# Patient Record
Sex: Female | Born: 1985
Health system: Southern US, Community
[De-identification: ages and names within clinical notes are randomized; demographics above are authoritative.]

## PROBLEM LIST (undated history)

## (undated) ENCOUNTER — Inpatient Hospital Stay (HOSPITAL_COMMUNITY): Payer: Self-pay

## (undated) DIAGNOSIS — IMO0002 Reserved for concepts with insufficient information to code with codable children: Secondary | ICD-10-CM

## (undated) DIAGNOSIS — Z975 Presence of (intrauterine) contraceptive device: Secondary | ICD-10-CM

## (undated) DIAGNOSIS — Z789 Other specified health status: Secondary | ICD-10-CM

## (undated) HISTORY — PX: COLPOSCOPY: SHX161

## (undated) HISTORY — PX: NO PAST SURGERIES: SHX2092

## (undated) HISTORY — DX: Presence of (intrauterine) contraceptive device: Z97.5

---

## 2009-04-19 ENCOUNTER — Ambulatory Visit: Payer: Self-pay | Admitting: Family Medicine

## 2009-04-19 ENCOUNTER — Ambulatory Visit (HOSPITAL_COMMUNITY): Admission: RE | Admit: 2009-04-19 | Discharge: 2009-04-19 | Payer: Self-pay | Admitting: Family Medicine

## 2009-09-26 ENCOUNTER — Inpatient Hospital Stay (HOSPITAL_COMMUNITY): Admission: AD | Admit: 2009-09-26 | Discharge: 2009-09-26 | Payer: Self-pay | Admitting: Obstetrics and Gynecology

## 2009-12-20 ENCOUNTER — Inpatient Hospital Stay (HOSPITAL_COMMUNITY): Admission: AD | Admit: 2009-12-20 | Discharge: 2009-12-22 | Payer: Self-pay | Admitting: Obstetrics and Gynecology

## 2009-12-21 ENCOUNTER — Encounter (INDEPENDENT_AMBULATORY_CARE_PROVIDER_SITE_OTHER): Payer: Self-pay | Admitting: Obstetrics and Gynecology

## 2010-04-08 ENCOUNTER — Ambulatory Visit: Payer: Self-pay | Admitting: Family Medicine

## 2010-06-12 NOTE — L&D Delivery Note (Signed)
Delivery Note   At 11:34 PM a viable female was delivered via Vaginal, Spontaneous Delivery (Presentation: ROA  ).  APGAR: 8, 9; weight 7 lb 3.9 oz (3285 g).   Placenta status: Intact, Spontaneous.  Cord: 3 vessels with the following complications: None.   Anesthesia: 2% lidocaine local infiltration Episiotomy: none Lacerations: 1st degree;Perineal Suture Repair: 3.0 vicryl rapide Est. Blood Loss (mL): 150  Mom to postpartum.  Baby to nursery-stable  Dr. Normand Sloop notified.  Ehtan Delfavero M 04/30/2011, 11:55 PM

## 2010-08-28 LAB — CBC
HCT: 37.6 % (ref 36.0–46.0)
Hemoglobin: 12.5 g/dL (ref 12.0–15.0)
MCH: 30.4 pg (ref 26.0–34.0)
MCHC: 33.3 g/dL (ref 30.0–36.0)
MCHC: 33.7 g/dL (ref 30.0–36.0)
MCV: 92.1 fL (ref 78.0–100.0)
Platelets: 164 10*3/uL (ref 150–400)
RDW: 14.3 % (ref 11.5–15.5)
WBC: 12.8 10*3/uL — ABNORMAL HIGH (ref 4.0–10.5)

## 2010-10-13 LAB — RUBELLA ANTIBODY, IGM: Rubella: IMMUNE

## 2010-10-13 LAB — CBC
HCT: 36 % (ref 36–46)
Platelets: 309 10*3/uL (ref 150–399)

## 2010-10-13 LAB — HEPATITIS B SURFACE ANTIGEN: Hepatitis B Surface Ag: NEGATIVE

## 2010-12-21 ENCOUNTER — Encounter: Payer: Self-pay | Admitting: Family Medicine

## 2011-04-13 ENCOUNTER — Encounter (HOSPITAL_COMMUNITY): Payer: Self-pay | Admitting: *Deleted

## 2011-04-13 ENCOUNTER — Inpatient Hospital Stay (HOSPITAL_COMMUNITY)
Admission: AD | Admit: 2011-04-13 | Discharge: 2011-04-13 | Disposition: A | Discharge status: Home / Self Care | Payer: Medicaid Other | Source: Ambulatory Visit | Attending: Obstetrics and Gynecology | Admitting: Obstetrics and Gynecology

## 2011-04-13 ENCOUNTER — Other Ambulatory Visit (HOSPITAL_COMMUNITY): Payer: Self-pay | Admitting: Obstetrics and Gynecology

## 2011-04-13 ENCOUNTER — Other Ambulatory Visit (HOSPITAL_COMMUNITY): Payer: Self-pay | Admitting: *Deleted

## 2011-04-13 DIAGNOSIS — O368390 Maternal care for abnormalities of the fetal heart rate or rhythm, unspecified trimester, not applicable or unspecified: Secondary | ICD-10-CM | POA: Insufficient documentation

## 2011-04-13 HISTORY — DX: Other specified health status: Z78.9

## 2011-04-13 HISTORY — DX: Reserved for concepts with insufficient information to code with codable children: IMO0002

## 2011-04-13 NOTE — Progress Notes (Addendum)
S:  Pt denies any complaints at the present time.  O:  FHR baseline 120 with moderate variability.  Accels present. No decels present.  FHR Cat 1.  No further arrhythmia       audible.       Toco:  No further UCs noted.       SVE deferred.       Remainder of PE not repeated.  A:  IUP 37w 3d      Intermittent fetal arrhythmia      Hx decreased amniotic fluid volume  P:  Discharge to home per consult with Dr. Su Hilt.      Pt instructed to return to [facility], tomorrow, 04/14/11 @ 3:000pm for ultrasound.      Reviewed signs/symptoms of labor and fetal kick counts.

## 2011-04-13 NOTE — ED Provider Notes (Signed)
History    Chief Complaint  Patient presents with  . Non-stress Test   HPI Pt is a 25yo G2 P1 sent from [facility] office due possible fetal bradycardia and fetal arrhythmia on monitor at office today.  Pt being followed with antepartum testing due to decreased amniotic fluid volumes.  Pt states that on ultrasound today her fluid volume was normal but states baby was "not practicing breathing" and that the heart rate did slow down while on the monitor.  She states the baby was "lying sideways" on ultrasound today. She feeling UCs, ROM or bldg.  She reports her fetus has been moving normally.  No actual paper result of ultrasound available at the time of pt visit.  She denies any other problems at the present time.  She denies use of caffeine.  OB History    Grav Para Term Preterm Abortions TAB SAB Ect Mult Living   2 1 1       1       Past Medical History  Diagnosis Date  . No pertinent past medical history   . Abnormal Pap smear     Resolved as of last pap.  Colpo 1/11    Past Surgical History  Procedure Date  . No past surgeries     Family History  Problem Relation Age of Onset  . Hypertension Maternal Grandmother   . Hypertension Paternal Grandmother     History  Substance Use Topics  . Smoking status: Never Smoker   . Smokeless tobacco: Never Used  . Alcohol Use: No    Allergies: No Known Allergies  Prescriptions prior to admission  Medication Sig Dispense Refill  . acetaminophen (TYLENOL) 325 MG tablet Take 650 mg by mouth every 6 (six) hours as needed. Pain        . Prenatal Vit-Fe Fumarate-FA (PRENATAL MULTIVITAMIN) 60-1 MG tablet Take 1 tablet by mouth daily.         Review of Systems  Constitutional: Negative.   HENT: Negative.   Eyes: Negative.   Respiratory: Negative.   Cardiovascular: Negative.   Gastrointestinal: Negative.   Genitourinary: Negative.   Musculoskeletal: Negative.   Skin: Negative.   Neurological: Negative.   Endo/Heme/Allergies:  Negative.   Psychiatric/Behavioral: Negative.    Physical Exam   Filed Vitals:   04/13/11 1958  BP: 106/53  Pulse: 71   Last menstrual period 07/24/2010. Physical Exam  Constitutional: She is oriented to person, place, and time. She appears well-developed and well-nourished.  HENT:  Head: Normocephalic and atraumatic.  Right Ear: External ear normal.  Left Ear: External ear normal.  Nose: Nose normal.  Eyes: Conjunctivae and EOM are normal. Pupils are equal, round, and reactive to light.  Neck: Normal range of motion. Neck supple. No thyromegaly present.  Cardiovascular: Normal rate, regular rhythm and intact distal pulses.   Respiratory: Effort normal and breath sounds normal.  GI: Soft. Bowel sounds are normal.  Genitourinary:       Deferred  Musculoskeletal: Normal range of motion.  Neurological: She is alert and oriented to person, place, and time. She has normal reflexes.  Skin: Skin is warm and dry.  Psychiatric: She has a normal mood and affect. Her behavior is normal. Judgment and thought content normal.   FHR baseline 130 bpm with moderate variability present.  Accels present.  No decels noted.  Intermittent, non-sustained arrhythmia noted.  FHR Cat 1 at present UCs every 3-5 minutes x 40-60 seconds and mild to palpation. Pt states she is  not feeling UCs.   MAU Course  Procedures FHR monitoring  Assessment and Plan  IUP at 37.3 wks Fetal arrhythmia   Patricia Faith O. 04/13/2011, 6:23 PM

## 2011-04-14 ENCOUNTER — Ambulatory Visit (HOSPITAL_COMMUNITY)
Admission: RE | Admit: 2011-04-14 | Discharge: 2011-04-14 | Disposition: A | Discharge status: Home / Self Care | Payer: Medicaid Other | Source: Ambulatory Visit | Attending: Obstetrics and Gynecology | Admitting: Obstetrics and Gynecology

## 2011-04-14 DIAGNOSIS — O4100X0 Oligohydramnios, unspecified trimester, not applicable or unspecified: Secondary | ICD-10-CM | POA: Insufficient documentation

## 2011-04-14 DIAGNOSIS — Z363 Encounter for antenatal screening for malformations: Secondary | ICD-10-CM | POA: Insufficient documentation

## 2011-04-14 DIAGNOSIS — O368390 Maternal care for abnormalities of the fetal heart rate or rhythm, unspecified trimester, not applicable or unspecified: Secondary | ICD-10-CM | POA: Insufficient documentation

## 2011-04-14 DIAGNOSIS — Z1389 Encounter for screening for other disorder: Secondary | ICD-10-CM | POA: Insufficient documentation

## 2011-04-14 DIAGNOSIS — O358XX0 Maternal care for other (suspected) fetal abnormality and damage, not applicable or unspecified: Secondary | ICD-10-CM | POA: Insufficient documentation

## 2011-04-14 DIAGNOSIS — O3440 Maternal care for other abnormalities of cervix, unspecified trimester: Secondary | ICD-10-CM | POA: Insufficient documentation

## 2011-04-25 ENCOUNTER — Encounter (HOSPITAL_COMMUNITY): Payer: Self-pay | Admitting: *Deleted

## 2011-04-25 ENCOUNTER — Inpatient Hospital Stay (HOSPITAL_COMMUNITY)
Admission: AD | Admit: 2011-04-25 | Discharge: 2011-04-25 | Payer: Medicaid Other | Source: Ambulatory Visit | Attending: Obstetrics and Gynecology | Admitting: Obstetrics and Gynecology

## 2011-04-25 DIAGNOSIS — O99891 Other specified diseases and conditions complicating pregnancy: Secondary | ICD-10-CM | POA: Insufficient documentation

## 2011-04-25 DIAGNOSIS — R109 Unspecified abdominal pain: Secondary | ICD-10-CM | POA: Insufficient documentation

## 2011-04-25 LAB — BASIC METABOLIC PANEL
Calcium: 10.4 mg/dL (ref 8.4–10.5)
Chloride: 101 mEq/L (ref 96–112)
Creatinine, Ser: 0.57 mg/dL (ref 0.50–1.10)
GFR calc non Af Amer: >90 mL/min (ref >90)
Sodium: 133 mEq/L — ABNORMAL LOW (ref 135–145)

## 2011-04-25 LAB — DIFFERENTIAL
Lymphocytes Relative: 28 % (ref 12–46)
Lymphs Abs: 2.1 10*3/uL (ref 0.7–4.0)
Monocytes Absolute: 0.7 10*3/uL (ref 0.1–1.0)
Monocytes Relative: 10 % (ref 3–12)
Neutro Abs: 4.4 10*3/uL (ref 1.7–7.7)

## 2011-04-25 LAB — CBC
HCT: 35.7 % — ABNORMAL LOW (ref 36.0–46.0)
Hemoglobin: 11.7 g/dL — ABNORMAL LOW (ref 12.0–15.0)
MCHC: 32.8 g/dL (ref 30.0–36.0)
MCV: 87.5 fL (ref 78.0–100.0)
Platelets: 165 10*3/uL (ref 150–400)
RBC: 4.08 MIL/uL (ref 3.87–5.11)
WBC: 7.4 10*3/uL (ref 4.0–10.5)

## 2011-04-25 NOTE — Progress Notes (Signed)
Patient states she has been having some contractions that come and go but has a constant pain in the lower abdomen that is constant. Fetal heart rate is irregular. Patient denies any bleeding or leaking.

## 2011-04-25 NOTE — Consult Note (Addendum)
The emergency consult note  Patricia Allen, Patricia Allen  Date of birth: 15-Mar-1986  Medical records number: [id number]  CSN: [id number]  History of illness:  The patient is a 25 year old female, gravida 2 para 1-0-0-1, who presents at 66 weeks and one-day gestation. She [facility] [place] [facility]. She complains of right lower quadrant pain. She denies bleeding and leakage of fluid. She has no GI or GU complaints. The patient complains of slight dizziness today.  Obstetrical history  The patient has had one term vaginal delivery.  Past medical history:  Noncontributory  Drug allergy:  None  Social history:  No cigarettes, alcohol, or recreational drug use.  Review of systems:  The history of present illness.  Family history:  Noncontributory  Physical exam:  BP 106/60  Pulse 81  Temp(Src) 99 F (37.2 C) (Oral)  Resp 20  Ht 5\' 4"  (1.626 m)  Wt 85.639 kg (188 lb 12.8 oz)  BMI 32.41 kg/m2  SpO2 98%  LMP 07/24/2010   HEENT: Normal  Chest: Clear  Heart: Regular rate and rhythm  Abdomen: Gravid and nontender  Pelvic exam :  The cervix is closed, 25% effaced, and -3 in station.  Nonstress test:  Category 1  CBC    Component Value Date/Time   WBC 7.4 04/25/2011 1807   RBC 4.08 04/25/2011 1807   HGB 11.7* 04/25/2011 1807   HCT 35.7* 04/25/2011 1807   PLT 165 04/25/2011 1807   MCV 87.5 04/25/2011 1807   MCH 28.7 04/25/2011 1807   MCHC 32.8 04/25/2011 1807   RDW 15.0 04/25/2011 1807   LYMPHSABS 2.1 04/25/2011 1807   MONOABS 0.7 04/25/2011 1807   EOSABS 0.1 04/25/2011 1807   BASOSABS 0.0 04/25/2011 1807    Basic metabolic panel: Pending  Assessment:  39 week and 1 day gestation  Right lower quadrant pain most likely related to uterine contractions and uterine irritation from an active baby  Dizziness of uncertain etiology  Plan:  The patient will be discharged to  home  She will followup in the office in 2 days  She will do kick counts at home  She will call if her pain gets worse or if the baby has decreased motion  She will take Tylenol for discomfort  Leonard Schwartz MD  Addendum: BMP WNL, with glucose 104.  Nigel Bridgeman, CNM 04/25/11 1935

## 2011-04-30 ENCOUNTER — Encounter (HOSPITAL_COMMUNITY): Payer: Self-pay | Admitting: *Deleted

## 2011-04-30 ENCOUNTER — Inpatient Hospital Stay (HOSPITAL_COMMUNITY)
Admission: AD | Admit: 2011-04-30 | Discharge: 2011-05-02 | DRG: 775 | Disposition: A | Discharge status: Home / Self Care | Payer: Medicaid Other | Source: Ambulatory Visit | Attending: Obstetrics and Gynecology | Admitting: Obstetrics and Gynecology

## 2011-04-30 ENCOUNTER — Encounter (HOSPITAL_COMMUNITY): Payer: Self-pay

## 2011-04-30 ENCOUNTER — Inpatient Hospital Stay (HOSPITAL_COMMUNITY)
Admission: AD | Admit: 2011-04-30 | Discharge: 2011-04-30 | Disposition: A | Discharge status: Home / Self Care | Payer: Medicaid Other | Source: Ambulatory Visit | Attending: Obstetrics and Gynecology | Admitting: Obstetrics and Gynecology

## 2011-04-30 DIAGNOSIS — O479 False labor, unspecified: Secondary | ICD-10-CM | POA: Insufficient documentation

## 2011-04-30 LAB — CBC
Platelets: 157 10*3/uL (ref 150–400)
RDW: 15 % (ref 11.5–15.5)
WBC: 9.3 10*3/uL (ref 4.0–10.5)

## 2011-04-30 MED ORDER — LIDOCAINE HCL (PF) 1 % IJ SOLN
30.0000 mL | INTRAMUSCULAR | Status: DC | PRN
  Administered 2011-04-30: 30 mL via SUBCUTANEOUS
  Filled 2011-04-30: qty 30

## 2011-04-30 MED ORDER — LACTATED RINGERS IV SOLN: INTRAVENOUS | Status: DC

## 2011-04-30 MED ORDER — OXYCODONE-ACETAMINOPHEN 5-325 MG PO TABS: 2.0000 | ORAL_TABLET | ORAL | Status: DC | PRN

## 2011-04-30 MED ORDER — OXYTOCIN BOLUS FROM INFUSION
500.0000 mL | Freq: Once | INTRAVENOUS | Status: DC
  Filled 2011-04-30: qty 1000
  Filled 2011-04-30: qty 500

## 2011-04-30 MED ORDER — BUTORPHANOL TARTRATE 2 MG/ML IJ SOLN: 2.0000 mg | INTRAMUSCULAR | Status: DC | PRN

## 2011-04-30 MED ORDER — ZOLPIDEM TARTRATE 10 MG PO TABS: 10.0000 mg | ORAL_TABLET | Freq: Every evening | ORAL | Status: DC | PRN

## 2011-04-30 MED ORDER — OXYTOCIN 20 UNITS IN LACTATED RINGERS INFUSION - SIMPLE
125.0000 mL/h | Freq: Once | INTRAVENOUS | Status: AC
  Administered 2011-04-30: 125 mL/h via INTRAVENOUS

## 2011-04-30 MED ORDER — ACETAMINOPHEN 325 MG PO TABS: 650.0000 mg | ORAL_TABLET | ORAL | Status: DC | PRN

## 2011-04-30 MED ORDER — ONDANSETRON HCL 4 MG/2ML IJ SOLN: 4.0000 mg | Freq: Four times a day (QID) | INTRAMUSCULAR | Status: DC | PRN

## 2011-04-30 MED ORDER — IBUPROFEN 600 MG PO TABS: 600.0000 mg | ORAL_TABLET | Freq: Four times a day (QID) | ORAL | Status: DC | PRN

## 2011-04-30 MED ORDER — OXYTOCIN 10 UNIT/ML IJ SOLN: 10.0000 [IU] | Freq: Once | INTRAMUSCULAR | Status: DC

## 2011-04-30 MED ORDER — CITRIC ACID-SODIUM CITRATE 334-500 MG/5ML PO SOLN: 30.0000 mL | ORAL | Status: DC | PRN

## 2011-04-30 MED ORDER — LACTATED RINGERS IV SOLN: 500.0000 mL | INTRAVENOUS | Status: DC | PRN

## 2011-04-30 NOTE — H&P (Signed)
Akili Cuda is a 25 y.o. female presenting for onset of ctx, they have been getting closer and stronger. Denies LOF, VB +FM.  Maternal Medical History:  Reason for admission: Reason for admission: contractions.  Contractions: Onset was 6-12 hours ago.   Frequency: regular.   Duration is approximately 60 seconds.   Perceived severity is strong.    Fetal activity: Perceived fetal activity is normal.   Last perceived fetal movement was within the past hour.    Prenatal complications: no prenatal complications   OB History    Grav Para Term Preterm Abortions TAB SAB Ect Mult Living   2 1 1       1      Past Medical History  Diagnosis Date  . No pertinent past medical history   . Abnormal Pap smear     Resolved as of last pap.  Colpo 1/11   Past Surgical History  Procedure Date  . No past surgeries    Family History: family history includes Hypertension in her maternal grandmother and paternal grandmother. Social History:  reports that she has never smoked. She has never used smokeless tobacco. She reports that she does not drink alcohol or use illicit drugs.  Review of Systems  All other systems reviewed and are negative.    Dilation: 8 Effacement (%): 100 Station: -2 Exam by:: Hoeler RN Blood pressure 126/76, pulse 105, temperature 97.9 F (36.6 C), temperature source Oral, resp. rate 20, height 5\' 4"  (1.626 m), weight 83.008 kg (183 lb), last menstrual period 07/24/2010. Maternal Exam:  Uterine Assessment: Contraction strength is firm.  Contraction duration is 60 seconds. Contraction frequency is regular.   Abdomen: Patient reports no abdominal tenderness. Fundal height is aga.   Estimated fetal weight is 8#.   Fetal presentation: vertex  Introitus: Normal vulva. Normal vagina.  Pelvis: adequate for delivery.   Cervix: Cervix evaluated by digital exam.     Fetal Exam Fetal Monitor Review: Mode: ultrasound.   Baseline rate: 120.  Variability: moderate (6-25 bpm).    Pattern: accelerations present and no decelerations.    Fetal State Assessment: Category I - tracings are normal.     Physical Exam  Nursing note and vitals reviewed. Constitutional: She is oriented to person, place, and time. She appears well-developed and well-nourished.  Cardiovascular: Normal rate.   Respiratory: Effort normal.  GI: Soft.  Genitourinary: Vagina normal.  Musculoskeletal: Normal range of motion.  Neurological: She is alert and oriented to person, place, and time.  Skin: Skin is warm and dry.  Psychiatric: She has a normal mood and affect. Her behavior is normal.    Prenatal labs: ABO, Rh: O/Positive/-- (05/03 0000) Antibody: Negative (05/03 0000) Rubella: Immune (05/03 0000) RPR: Nonreactive (05/03 0000)  HBsAg: Negative (05/03 0000)  HIV: Non-reactive (05/03 0000)  GBS:   neg  Assessment/Plan: Active labor FHR reassuring - audible arrythmia   Admit to BS IV pain meds PRN   LILLARD,SHELLEY M 04/30/2011, 11:09 PM

## 2011-04-30 NOTE — Progress Notes (Signed)
Contractions for 2 weeks, now closer and stronger, some vaginal bleeding.

## 2011-04-30 NOTE — ED Provider Notes (Signed)
History     Chief Complaint  Patient presents with  . Contractions   HPI Comments: Pt is a G2P1 at [redacted]w[redacted]d with cc of ctx all night and closer this morning, also bloody show, no LOF, +FM.       Past Medical History  Diagnosis Date  . No pertinent past medical history   . Abnormal Pap smear     Resolved as of last pap.  Colpo 1/11    Past Surgical History  Procedure Date  . No past surgeries     Family History  Problem Relation Age of Onset  . Hypertension Maternal Grandmother   . Hypertension Paternal Grandmother     History  Substance Use Topics  . Smoking status: Never Smoker   . Smokeless tobacco: Never Used  . Alcohol Use: No    Allergies: No Known Allergies  Prescriptions prior to admission  Medication Sig Dispense Refill  . acetaminophen (TYLENOL) 325 MG tablet Take 650 mg by mouth every 6 (six) hours as needed. For pain       . Prenatal Vit-Fe Fumarate-FA (PRENATAL MULTIVITAMIN) 60-1 MG tablet Take 1 tablet by mouth daily.         Review of Systems  All other systems reviewed and are negative.   Physical Exam   Blood pressure 99/59, pulse 107, temperature 98.1 F (36.7 C), temperature source Oral, height 5' 4.5" (1.638 m), weight 83.371 kg (183 lb 12.8 oz), last menstrual period 07/24/2010.  Physical Exam  Nursing note and vitals reviewed. Constitutional: She is oriented to person, place, and time. She appears well-developed and well-nourished.  Neck: Normal range of motion.  Cardiovascular: Normal rate.   Respiratory: Effort normal.  GI: Soft.  Genitourinary: Vagina normal.       Cx=2/60/-2 Scant mucous/bloody show  Musculoskeletal: Normal range of motion.  Neurological: She is alert and oriented to person, place, and time.  Skin: Skin is warm and dry.  Psychiatric: She has a normal mood and affect. Her behavior is normal.   FHR 130 cat 1 Toco - not tracing   MAU Course  Procedures   Assessment and Plan  IUP at [redacted]w[redacted]d +GBS FHR  reassuring  Will continue observe and recheck cervix in 1hr  Patricia Allen M 04/30/2011, 10:54 AM   No cervical change FHR reactive D/C home F/u in office as scheduled on Wed [facility] and false labor sx's given RX for [place] for rest

## 2011-04-30 NOTE — ED Notes (Signed)
Patient does report that two weeks ago baby was breech.

## 2011-05-01 ENCOUNTER — Encounter (HOSPITAL_COMMUNITY): Payer: Self-pay | Admitting: *Deleted

## 2011-05-01 LAB — CBC
HCT: 37.7 % (ref 36.0–46.0)
Hemoglobin: 12.5 g/dL (ref 12.0–15.0)
MCHC: 33.2 g/dL (ref 30.0–36.0)
MCV: 87.5 fL (ref 78.0–100.0)
RDW: 14.9 % (ref 11.5–15.5)

## 2011-05-01 MED ORDER — OXYTOCIN 20 UNITS IN LACTATED RINGERS INFUSION - SIMPLE: 125.0000 mL/h | Freq: Once | INTRAVENOUS | Status: DC

## 2011-05-01 MED ORDER — OXYTOCIN BOLUS FROM INFUSION: 500.0000 mL | Freq: Once | INTRAVENOUS | Status: DC

## 2011-05-01 MED ORDER — ONDANSETRON HCL 4 MG/2ML IJ SOLN: 4.0000 mg | Freq: Four times a day (QID) | INTRAMUSCULAR | Status: DC | PRN

## 2011-05-01 MED ORDER — OXYTOCIN 10 UNIT/ML IJ SOLN: 10.0000 [IU] | Freq: Once | INTRAMUSCULAR | Status: DC

## 2011-05-01 MED ORDER — IBUPROFEN 600 MG PO TABS
600.0000 mg | ORAL_TABLET | Freq: Four times a day (QID) | ORAL | Status: DC
  Administered 2011-05-01 – 2011-05-02 (×6): 600 mg via ORAL
  Filled 2011-05-01 (×6): qty 1

## 2011-05-01 MED ORDER — ACETAMINOPHEN 325 MG PO TABS: 650.0000 mg | ORAL_TABLET | ORAL | Status: DC | PRN

## 2011-05-01 MED ORDER — IBUPROFEN 600 MG PO TABS: 600.0000 mg | ORAL_TABLET | Freq: Four times a day (QID) | ORAL | Status: DC | PRN

## 2011-05-01 MED ORDER — OXYCODONE-ACETAMINOPHEN 5-325 MG PO TABS
1.0000 | ORAL_TABLET | ORAL | Status: DC | PRN
  Administered 2011-05-01: 1 via ORAL
  Filled 2011-05-01: qty 1

## 2011-05-01 MED ORDER — BUTORPHANOL TARTRATE 2 MG/ML IJ SOLN: 2.0000 mg | INTRAMUSCULAR | Status: DC | PRN

## 2011-05-01 MED ORDER — ZOLPIDEM TARTRATE 5 MG PO TABS: 5.0000 mg | ORAL_TABLET | Freq: Every evening | ORAL | Status: DC | PRN

## 2011-05-01 MED ORDER — BENZOCAINE-MENTHOL 20-0.5 % EX AERO
INHALATION_SPRAY | CUTANEOUS | Status: AC
  Administered 2011-05-01: 1 via TOPICAL
  Filled 2011-05-01: qty 56

## 2011-05-01 MED ORDER — WITCH HAZEL-GLYCERIN EX PADS: 1.0000 "application " | MEDICATED_PAD | CUTANEOUS | Status: DC | PRN

## 2011-05-01 MED ORDER — OXYCODONE-ACETAMINOPHEN 5-325 MG PO TABS: 2.0000 | ORAL_TABLET | ORAL | Status: DC | PRN

## 2011-05-01 MED ORDER — CITRIC ACID-SODIUM CITRATE 334-500 MG/5ML PO SOLN: 30.0000 mL | ORAL | Status: DC | PRN

## 2011-05-01 MED ORDER — PRENATAL PLUS 27-1 MG PO TABS
1.0000 | ORAL_TABLET | Freq: Every day | ORAL | Status: DC
  Administered 2011-05-01 – 2011-05-02 (×2): 1 via ORAL
  Filled 2011-05-01 (×2): qty 1

## 2011-05-01 MED ORDER — LANOLIN HYDROUS EX OINT: TOPICAL_OINTMENT | CUTANEOUS | Status: DC | PRN

## 2011-05-01 MED ORDER — LACTATED RINGERS IV SOLN: 500.0000 mL | INTRAVENOUS | Status: DC | PRN

## 2011-05-01 MED ORDER — ONDANSETRON HCL 4 MG/2ML IJ SOLN: 4.0000 mg | INTRAMUSCULAR | Status: DC | PRN

## 2011-05-01 MED ORDER — BENZOCAINE-MENTHOL 20-0.5 % EX AERO
1.0000 "application " | INHALATION_SPRAY | CUTANEOUS | Status: DC | PRN
  Administered 2011-05-01: 1 via TOPICAL

## 2011-05-01 MED ORDER — LACTATED RINGERS IV SOLN: INTRAVENOUS | Status: DC

## 2011-05-01 MED ORDER — SIMETHICONE 80 MG PO CHEW: 80.0000 mg | CHEWABLE_TABLET | ORAL | Status: DC | PRN

## 2011-05-01 MED ORDER — TETANUS-DIPHTH-ACELL PERTUSSIS 5-2.5-18.5 LF-MCG/0.5 IM SUSP: 0.5000 mL | Freq: Once | INTRAMUSCULAR | Status: DC

## 2011-05-01 MED ORDER — LIDOCAINE HCL (PF) 1 % IJ SOLN: 30.0000 mL | INTRAMUSCULAR | Status: DC | PRN

## 2011-05-01 MED ORDER — DIBUCAINE 1 % RE OINT: 1.0000 "application " | TOPICAL_OINTMENT | RECTAL | Status: DC | PRN

## 2011-05-01 MED ORDER — MEASLES, MUMPS & RUBELLA VAC ~~LOC~~ INJ
0.5000 mL | INJECTION | Freq: Once | SUBCUTANEOUS | Status: DC
  Filled 2011-05-01: qty 0.5

## 2011-05-01 MED ORDER — DIPHENHYDRAMINE HCL 25 MG PO CAPS: 25.0000 mg | ORAL_CAPSULE | Freq: Four times a day (QID) | ORAL | Status: DC | PRN

## 2011-05-01 MED ORDER — SENNOSIDES-DOCUSATE SODIUM 8.6-50 MG PO TABS
2.0000 | ORAL_TABLET | Freq: Every day | ORAL | Status: DC
  Administered 2011-05-01: 2 via ORAL

## 2011-05-01 MED ORDER — ONDANSETRON HCL 4 MG PO TABS: 4.0000 mg | ORAL_TABLET | ORAL | Status: DC | PRN

## 2011-05-01 NOTE — Progress Notes (Addendum)
Post Partum Day 1 Subjective: no complaints, up ad lib, voiding, tolerating PO, + flatus and VB lightening.  Working on [employer].  Significant other and son at [facility].  No dizziness.  Newborn in Nursery at this time.  Objective: Blood pressure 112/64, pulse 85, temperature 98.2 F (36.8 C), temperature source Oral, resp. rate 20, height 5\' 4"  (1.626 m), weight 83.008 kg (183 lb), last menstrual period 07/24/2010, SpO2 99.00%, unknown if currently breastfeeding.  Physical Exam:  General: alert, cooperative, no distress and moderately obese Lochia: appropriate Uterine Fundus: firm, below umbilicus Incision: n/a DVT Evaluation: No evidence of DVT seen on physical exam. Negative Homan's sign. No significant calf/ankle edema.   Basename 05/01/11 0520 04/30/11 2240  HGB 12.5 12.4  HCT 37.7 37.2    Assessment/Plan: Plan for discharge tomorrow and Breastfeeding   LOS: 1 day   STEELMAN,CANDICE H 05/01/2011, 2:51 PM    Agree with Above - AYR

## 2011-05-01 NOTE — Progress Notes (Signed)
SVD of viable female 

## 2011-05-01 NOTE — Progress Notes (Signed)
UR chart review completed.  

## 2011-05-02 MED ORDER — IBUPROFEN 600 MG PO TABS: 600.0000 mg | ORAL_TABLET | Freq: Four times a day (QID) | ORAL | Status: AC | PRN

## 2011-05-02 NOTE — Discharge Summary (Signed)
Obstetric Discharge Summary Reason for Admission: onset of labor Prenatal Procedures: ultrasound Intrapartum Procedures: spontaneous vaginal delivery Postpartum Procedures: none Complications-Operative and Postpartum: 1st degree perineal laceration Hemoglobin  Date Value Range Status  05/01/2011 12.5  12.0-15.0 (g/dL) Final     HCT  Date Value Range Status  05/01/2011 37.7  36.0-46.0 (%) Final    Discharge Diagnoses: Term Pregnancy-delivered and lactating; closely-spaced pregnancies; h/o abnl pap smears; conceived on OCP's in the past; limited Prenatal care this pregnancy  Discharge Information: Date: 05/02/2011 Activity: pelvic rest Diet: routine Medications: PNV, Ibuprofen and Colace Condition: stable Instructions: refer to practice specific booklet Discharge to: home Follow-up Information    Follow up with [facility]. (call the office to schedule an appointment in 6 weeks, or call  as needed)          Newborn Data: Live born female "Gavin Pound" (delivered by Sanda Klein, CNM) Birth Weight: 7 lb 3.9 oz (3285 g) APGAR: 8, 9  Home with mother.  Khale Nigh H 05/02/2011, 11:27 AM

## 2012-05-29 ENCOUNTER — Encounter: Payer: Self-pay | Admitting: Medical

## 2012-05-29 ENCOUNTER — Ambulatory Visit (INDEPENDENT_AMBULATORY_CARE_PROVIDER_SITE_OTHER): Payer: Medicaid Other | Admitting: Medical

## 2012-05-29 VITALS — BP 108/70 | HR 80 | Temp 98.0°F | Resp 16 | Wt 172.0 lb

## 2012-05-29 DIAGNOSIS — M62838 Other muscle spasm: Secondary | ICD-10-CM

## 2012-05-29 DIAGNOSIS — G8929 Other chronic pain: Secondary | ICD-10-CM

## 2012-05-29 DIAGNOSIS — M549 Dorsalgia, unspecified: Secondary | ICD-10-CM

## 2012-05-29 MED ORDER — CYCLOBENZAPRINE HCL 10 MG PO TABS: ORAL_TABLET | ORAL | Status: DC

## 2012-05-29 MED ORDER — DICLOFENAC SODIUM 75 MG PO TBEC: 75.0000 mg | DELAYED_RELEASE_TABLET | Freq: Two times a day (BID) | ORAL | Status: DC

## 2012-05-29 NOTE — Progress Notes (Signed)
Subjective: Here for c/o back pain.   She is originally from [place].  She speaks [place] and is here with an interpreter today.  She notes 3 year hx/o back pain, worse in the last 3 mo.  Started after epidural after childbirth.  Pain is intermittent, seems to be worse at night, sometimes interferes with sleep.  Sometimes feels hot sensation, sometimes pain goes up to her head.  Pain is generalized of her back, and at times more mid back midline.  Denies weakness, numbness, tingling, no changes in voiding, no incontinence, no fevers, no weight loss.  Not necessarily worse with activity.  She does not exercise regularly.  Not working currently.   No other aggravating or relieving factors.  Was seen here for this in 2011 and NSAIDs were prescribed short term.   No significant past medical hx/o other than hx/o abnormal pap smear  Review of Systems Constitutional: -fever, -chills, -sweats, -unexpected -weight change,-fatigue Cardiology:  -chest pain, -palpitations, -edema Respiratory: -cough, -shortness of breath, -wheezing Gastroenterology: -abdominal pain, -nausea, -vomiting, -diarrhea, -constipation  Hematology: -bleeding or bruising problems Musculoskeletal: -arthralgias, -myalgias, -joint swelling Ophthalmology: -vision changes Urology: -dysuria, -difficulty urinating, -hematuria, -urinary frequency, -urgency Neurology: +headaches occasional, -weakness, -tingling, -numbness   Objective: Gen: wd, wn, nad, AA female Skin: unremarkable Back: tender throughout paraspinal muscles, some midline mild tenderness, but normal ROM, no obvious scoliosis or deformity MSK: nontender arms, legs, normal ROM, no obvious deformity Neuro: normal UE and LE strength, sensation, DTRs Abdomen: +bs, soft, nontender, no mass, no organomegaly pulses normal  Assessment: Encounter Diagnoses  Name Primary?  . Chronic back pain Yes  . Muscle spasm    Plan: Initially this seems to be back spasm and  musculoskeletal pain.   Will send for xrays.  Begin trial of Diclofenac BID, Flexeril prn, heat, massage, begin daily stretching and some form of exercise.   I did advise her to f/u soon for physical and recheck on history of abnormal pap.

## 2012-05-29 NOTE — Patient Instructions (Signed)
Your symptoms and exam suggests musculoskeletal back pain and spasm  I recommend  Daily stretching of the back  Daily exercise such as walking  You can use heat pad as needed to the back  Consider getting a massage from a massage therapist  Begin Diclofenac twice daily for pain and inflammation  Begin Flexeril at night time for spasm.  This can make you sleepy. Use this as needed, not necessarily every day  We will send you for back xray  I recommend a completed physical sometime in the near future

## 2012-05-30 ENCOUNTER — Ambulatory Visit
Admission: RE | Admit: 2012-05-30 | Discharge: 2012-05-30 | Disposition: A | Discharge status: Home / Self Care | Payer: Medicaid Other | Source: Ambulatory Visit | Attending: Medical | Admitting: Medical

## 2012-05-30 DIAGNOSIS — G8929 Other chronic pain: Secondary | ICD-10-CM

## 2012-05-30 DIAGNOSIS — M62838 Other muscle spasm: Secondary | ICD-10-CM

## 2012-05-31 ENCOUNTER — Ambulatory Visit (INDEPENDENT_AMBULATORY_CARE_PROVIDER_SITE_OTHER): Payer: Medicaid Other | Admitting: Medical

## 2012-05-31 ENCOUNTER — Encounter: Payer: Self-pay | Admitting: Medical

## 2012-05-31 VITALS — BP 102/70 | HR 68 | Temp 98.2°F | Resp 16

## 2012-05-31 DIAGNOSIS — M549 Dorsalgia, unspecified: Secondary | ICD-10-CM

## 2012-05-31 DIAGNOSIS — G8929 Other chronic pain: Secondary | ICD-10-CM

## 2012-05-31 NOTE — Progress Notes (Signed)
Subjective: Here for c/o back pain.  I saw her few days ago.  Here with her husband today who speaks english.   She notes 3 year hx/o back pain, worse in the last 3 mo.  Started after epidural after childbirth.  Pain is intermittent, seems to be worse at night, sometimes interferes with sleep.  Sometimes feels hot sensation, sometimes pain goes up to her head.  Pain is generalized of her back, and at times more mid back midline.  Denies weakness, numbness, tingling, no changes in voiding, no incontinence, no fevers, no weight loss.  Not necessarily worse with activity.  She does not exercise regularly.  Not working currently.   No other aggravating or relieving factors.  Was seen here for this in 2011 and NSAIDs were prescribed short term.   No significant past medical hx/o other than hx/o abnormal pap smear  Review of Systems Constitutional: -fever, -chills, -sweats, -unexpected -weight change,-fatigue Cardiology:  -chest pain, -palpitations, -edema Respiratory: -cough, -shortness of breath, -wheezing Gastroenterology: -abdominal pain, -nausea, -vomiting, -diarrhea, -constipation  Hematology: -bleeding or bruising problems Musculoskeletal: -arthralgias, -myalgias, -joint swelling Ophthalmology: -vision changes Urology: -dysuria, -difficulty urinating, -hematuria, -urinary frequency, -urgency Neurology: +headaches occasional, -weakness, -tingling, -numbness   Objective: Gen: wd, wn, nad, AA female Not examined  Assessment: Encounter Diagnosis  Name Primary?  . Chronic back pain Yes   Plan: Reviewed xray of thoracolumbar spine.  Discussed findings.  Advised stretching, some routine exercise, begin the medications I prescribed last visit, NSAID and prn muscle relaxer.  Will refer to physical therapy.  F/u in 52mo for recheck

## 2012-06-03 ENCOUNTER — Other Ambulatory Visit: Payer: Self-pay

## 2012-06-03 DIAGNOSIS — M549 Dorsalgia, unspecified: Secondary | ICD-10-CM

## 2012-06-11 ENCOUNTER — Ambulatory Visit: Payer: Medicaid Other | Attending: Medical | Admitting: Physical Therapy

## 2012-06-11 DIAGNOSIS — M545 Low back pain, unspecified: Secondary | ICD-10-CM | POA: Insufficient documentation

## 2012-06-11 DIAGNOSIS — IMO0001 Reserved for inherently not codable concepts without codable children: Secondary | ICD-10-CM | POA: Insufficient documentation

## 2012-06-11 DIAGNOSIS — M546 Pain in thoracic spine: Secondary | ICD-10-CM | POA: Insufficient documentation

## 2012-06-14 ENCOUNTER — Telehealth: Payer: Self-pay | Admitting: Family Medicine

## 2012-06-14 NOTE — Telephone Encounter (Signed)
I fax over OV notes,insurance card and xray report to [facility] and they will call the patient to schedule the PT referral. CLS [phone]

## 2012-06-19 ENCOUNTER — Ambulatory Visit: Payer: Medicaid Other | Attending: Medical | Admitting: Physical Therapy

## 2012-06-19 DIAGNOSIS — M545 Low back pain, unspecified: Secondary | ICD-10-CM | POA: Insufficient documentation

## 2012-06-19 DIAGNOSIS — IMO0001 Reserved for inherently not codable concepts without codable children: Secondary | ICD-10-CM | POA: Insufficient documentation

## 2012-06-19 DIAGNOSIS — M546 Pain in thoracic spine: Secondary | ICD-10-CM | POA: Insufficient documentation

## 2012-06-26 ENCOUNTER — Ambulatory Visit: Payer: Medicaid Other | Admitting: Physical Therapy

## 2012-07-03 ENCOUNTER — Ambulatory Visit: Payer: Medicaid Other | Admitting: Rehabilitation

## 2012-11-12 ENCOUNTER — Ambulatory Visit: Payer: Self-pay | Admitting: Family Medicine

## 2013-06-27 ENCOUNTER — Emergency Department (HOSPITAL_COMMUNITY)
Admission: EM | Admit: 2013-06-27 | Discharge: 2013-06-27 | Disposition: A | Discharge status: Home / Self Care | Payer: Medicaid Other | Attending: Emergency Medicine | Admitting: Emergency Medicine

## 2013-06-27 ENCOUNTER — Encounter (HOSPITAL_COMMUNITY): Payer: Self-pay | Admitting: Emergency Medicine

## 2013-06-27 ENCOUNTER — Emergency Department (HOSPITAL_COMMUNITY): Payer: Medicaid Other

## 2013-06-27 DIAGNOSIS — M25579 Pain in unspecified ankle and joints of unspecified foot: Secondary | ICD-10-CM | POA: Insufficient documentation

## 2013-06-27 DIAGNOSIS — R079 Chest pain, unspecified: Secondary | ICD-10-CM | POA: Insufficient documentation

## 2013-06-27 DIAGNOSIS — G8929 Other chronic pain: Secondary | ICD-10-CM | POA: Insufficient documentation

## 2013-06-27 DIAGNOSIS — M25539 Pain in unspecified wrist: Secondary | ICD-10-CM | POA: Insufficient documentation

## 2013-06-27 DIAGNOSIS — M25569 Pain in unspecified knee: Secondary | ICD-10-CM | POA: Insufficient documentation

## 2013-06-27 DIAGNOSIS — M545 Low back pain, unspecified: Secondary | ICD-10-CM | POA: Insufficient documentation

## 2013-06-27 DIAGNOSIS — Z3202 Encounter for pregnancy test, result negative: Secondary | ICD-10-CM | POA: Insufficient documentation

## 2013-06-27 DIAGNOSIS — R21 Rash and other nonspecific skin eruption: Secondary | ICD-10-CM | POA: Insufficient documentation

## 2013-06-27 DIAGNOSIS — M2550 Pain in unspecified joint: Secondary | ICD-10-CM

## 2013-06-27 LAB — CBC
HCT: 37.5 % (ref 36.0–46.0)
HEMOGLOBIN: 12.6 g/dL (ref 12.0–15.0)
MCH: 26.7 pg (ref 26.0–34.0)
MCHC: 33.6 g/dL (ref 30.0–36.0)
MCV: 79.4 fL (ref 78.0–100.0)
Platelets: 296 10*3/uL (ref 150–400)
RBC: 4.72 MIL/uL (ref 3.87–5.11)
RDW: 14 % (ref 11.5–15.5)
WBC: 8.6 10*3/uL (ref 4.0–10.5)

## 2013-06-27 LAB — POCT I-STAT TROPONIN I: Troponin i, poc: 0 ng/mL (ref 0.00–0.08)

## 2013-06-27 LAB — BASIC METABOLIC PANEL
BUN: 9 mg/dL (ref 6–23)
CO2: 22 meq/L (ref 19–32)
Calcium: 9.4 mg/dL (ref 8.4–10.5)
Chloride: 106 mEq/L (ref 96–112)
Creatinine, Ser: 0.76 mg/dL (ref 0.50–1.10)
GFR calc non Af Amer: >90 mL/min (ref >90)
Glucose, Bld: 115 mg/dL — ABNORMAL HIGH (ref 70–99)
POTASSIUM: 4.2 meq/L (ref 3.7–5.3)
Sodium: 140 mEq/L (ref 137–147)

## 2013-06-27 LAB — C-REACTIVE PROTEIN: CRP: 2.7 mg/dL — ABNORMAL HIGH (ref <0.60)

## 2013-06-27 LAB — POCT PREGNANCY, URINE: PREG TEST UR: NEGATIVE

## 2013-06-27 MED ORDER — KETOROLAC TROMETHAMINE 30 MG/ML IJ SOLN
30.0000 mg | Freq: Once | INTRAMUSCULAR | Status: AC
  Administered 2013-06-27: 30 mg via INTRAVENOUS
  Filled 2013-06-27: qty 1

## 2013-06-27 MED ORDER — IBUPROFEN 800 MG PO TABS: 800.0000 mg | ORAL_TABLET | Freq: Three times a day (TID) | ORAL | Status: DC | PRN

## 2013-06-27 NOTE — ED Notes (Signed)
Returned from xray

## 2013-06-27 NOTE — ED Notes (Signed)
Pt able to ambulate to bathroom without assistance

## 2013-06-27 NOTE — ED Provider Notes (Signed)
TIME SEEN: 7:43 AM  CHIEF COMPLAINT: Joint pain, chest pain  HPI: Patient is a 28 y.o. female who has no significant past medical history who presents emergency department with pain in her bilateral elbows and wrists and knees and ankles that started yesterday evening. She reports the pain is worse with movement. She denies a history of injury. She states she did have a rash yesterday that she describes as red and diffuse but is now gone. This morning at 4 AM she woke up with central chest pain that she describes as a "pulling".  She denies any shortness of breath, diaphoresis, nausea or vomiting, fevers or chills. No history of similar symptoms. She states the pain does not radiate. No aggravating or alleviating factors to her chest pain. She has a history of chronic intermittent lower back pain. No prior history of any autoimmune disease, sickle cell. No family history of any medical problems. No history of any injury. No headache, neck pain or neck stiffness. No numbness, tingling or focal weakness.  Denies any history of PE or DVT. No recent prolonged immobilization such as long flight or hospitalization, trauma, surgery, fracture, exogenous hormone use  ROS: See HPI Constitutional: no fever  Eyes: no drainage  ENT: no runny nose   Cardiovascular:   chest pain  Resp: no SOB  GI: no vomiting GU: no dysuria Integumentary: no rash  Allergy: no hives  Musculoskeletal: no leg swelling  Neurological: no slurred speech ROS otherwise negative  PAST MEDICAL HISTORY/PAST SURGICAL HISTORY:  Past Medical History  Diagnosis Date  . No pertinent past medical history   . Abnormal Pap smear     Resolved as of last pap.  Colpo 1/11    MEDICATIONS:  Prior to Admission medications   Medication Sig Start Date End Date Taking? Authorizing Provider  ibuprofen (ADVIL,MOTRIN) 200 MG tablet Take 200 mg by mouth every 6 (six) hours as needed for moderate pain.   Yes Historical Provider, MD     ALLERGIES:  Allergies  Allergen Reactions  . Apple Itching    SOCIAL HISTORY:  History  Substance Use Topics  . Smoking status: Never Smoker   . Smokeless tobacco: Never Used  . Alcohol Use: No    FAMILY HISTORY: Family History  Problem Relation Age of Onset  . Hypertension Maternal Grandmother   . Hypertension Paternal Grandmother     EXAM: BP 136/89  Pulse 88  Temp(Src) 98.7 F (37.1 C) (Oral)  Resp 19  SpO2 100%  LMP 06/27/2013 CONSTITUTIONAL: Alert and oriented and responds appropriately to questions. Well-appearing; well-nourished, nontoxic, in no distress HEAD: Normocephalic EYES: Conjunctivae clear, PERRL ENT: normal nose; no rhinorrhea; moist mucous membranes; pharynx without lesions noted NECK: Supple, no meningismus, no LAD  CARD: RRR; S1 and S2 appreciated; no murmurs, no clicks, no rubs, no gallops RESP: Normal chest excursion without splinting or tachypnea; breath sounds clear and equal bilaterally; no wheezes, no rhonchi, no rales ABD/GI: Normal bowel sounds; non-distended; soft, non-tender, no rebound, no guarding BACK:  The back appears normal and is non-tender to palpation, there is no CVA tenderness EXT: Patient is tender to palpation over her bilateral elbows, wrists, knees and ankles with no effusion, erythema, warmth, induration or fluctuance; 2+ radial and DP pulses bilaterally, sensation to light touch intact diffusely, Normal ROM in all joints; otherwise extremities are non-tender to palpation; no edema; normal capillary refill; no cyanosis; no calf tenderness or swelling SKIN: Normal color for age and race; warm; no rash NEURO:  Moves all extremities equally, cranial nerves 2-12 intact, sensation to light touch intact diffusely PSYCH: The patient's mood and manner are appropriate. Grooming and personal hygiene are appropriate.  MEDICAL DECISION MAKING: Patient here with joint pain that started yesterday and now chest pain. She is no signs of  septic arthritis on exam. No risk factors for ACS or pulmonary embolus. She describes her chest pain as a pulling sensation without any other associated symptoms and denies any radiation of chest pain to me.  No tearing sensation. She has equal pulses in all extremities.  I am not concerned for dissection at this time. Patient denies any history of autoimmune disease, sickle cell. No family history of any medical problems. Will check basic labs, CRP, chest x-ray. Will give Toradol and reassess.  ED PROGRESS: She reports feeling much better after Toradol. Her labs are unremarkable. Chest x-ray clear. Troponin negative. I do not feel she needs serial sets of enzymes given she has no risk factors for ACS and her pain has been constant since early this morning. Bedside ultrasound shows normal ejection fraction with normal-appearing valves with no obvious vegetation and no pericardial effusion. I discussed with patient and her husband that she will need to followup with her primary care physician. They will call to schedule an appointment this week. Have given return precautions. We'll discharge with prescription for ibuprofen. Patient and husband at bedside verbalize understanding and are comfortable with plan.    EKG Interpretation    Date/Time:  Friday June 27 2013 07:18:53 EST Ventricular Rate:  85 PR Interval:  157 QRS Duration: 77 QT Interval:  356 QTC Calculation: 423 R Axis:   76 Text Interpretation:  Sinus rhythm Borderline T abnormalities, anterior leads No old tracing to compare Confirmed by Criss Pallone  DO, Jcion Buddenhagen ([id number]) on 06/27/2013 7:55:09 AM             Layla MawKristen N Tylerjames Hoglund, DO 06/27/13 [id number]

## 2013-06-27 NOTE — ED Notes (Signed)
Patient transported to X-ray 

## 2013-06-27 NOTE — Discharge Instructions (Signed)
Arthralgia °Your caregiver has diagnosed you as suffering from an arthralgia. Arthralgia means there is pain in a joint. This can come from many reasons including: °· Bruising the joint which causes soreness (inflammation) in the joint. °· Wear and tear on the joints which occur as we grow older (osteoarthritis). °· Overusing the joint. °· Various forms of arthritis. °· Infections of the joint. °Regardless of the cause of pain in your joint, most of these different pains respond to anti-inflammatory drugs and rest. The exception to this is when a joint is infected, and these cases are treated with antibiotics, if it is a bacterial infection. °HOME CARE INSTRUCTIONS  °· Rest the injured area for as long as directed by your caregiver. Then slowly start using the joint as directed by your caregiver and as the pain allows. Crutches as directed may be useful if the ankles, knees or hips are involved. If the knee was splinted or casted, continue use and care as directed. If an stretchy or elastic wrapping bandage has been applied today, it should be removed and re-applied every 3 to 4 hours. It should not be applied tightly, but firmly enough to keep swelling down. Watch toes and feet for swelling, bluish discoloration, coldness, numbness or excessive pain. If any of these problems (symptoms) occur, remove the ace bandage and re-apply more loosely. If these symptoms persist, contact your caregiver or return to this location. °· For the first 24 hours, keep the injured extremity elevated on pillows while lying down. °· Apply ice for 15-20 minutes to the sore joint every couple hours while awake for the first half day. Then 03-04 times per day for the first 48 hours. Put the ice in a plastic bag and place a towel between the bag of ice and your skin. °· Wear any splinting, casting, elastic bandage applications, or slings as instructed. °· Only take over-the-counter or prescription medicines for pain, discomfort, or fever as  directed by your caregiver. Do not use aspirin immediately after the injury unless instructed by your physician. Aspirin can cause increased bleeding and bruising of the tissues. °· If you were given crutches, continue to use them as instructed and do not resume weight bearing on the sore joint until instructed. °Persistent pain and inability to use the sore joint as directed for more than 2 to 3 days are warning signs indicating that you should see a caregiver for a follow-up visit as soon as possible. Initially, a hairline fracture (break in bone) may not be evident on X-rays. Persistent pain and swelling indicate that further evaluation, non-weight bearing or use of the joint (use of crutches or slings as instructed), or further X-rays are indicated. X-rays may sometimes not show a small fracture until a week or 10 days later. Make a follow-up appointment with your own caregiver or one to whom we have referred you. A radiologist (specialist in reading X-rays) may read your X-rays. Make sure you know how you are to obtain your X-ray results. Do not assume everything is normal if you do not hear from us. °SEEK MEDICAL CARE IF: °Bruising, swelling, or pain increases. °SEEK IMMEDIATE MEDICAL CARE IF:  °· Your fingers or toes are numb or blue. °· The pain is not responding to medications and continues to stay the same or get worse. °· The pain in your joint becomes severe. °· You develop a fever over 102° F (38.9° C). °· It becomes impossible to move or use the joint. °MAKE SURE YOU:  °·   Understand these instructions.  Will watch your condition.  Will get help right away if you are not doing well or get worse. Document Released: 05/29/2005 Document Revised: 08/21/2011 Document Reviewed: 01/15/2008 [facility] Patient Information 2014 [place].  Chest Pain (Nonspecific) It is often hard to give a specific diagnosis for the cause of chest pain. There is always a chance that your pain could be related to  something serious, such as a heart attack or a blood clot in the lungs. You need to follow up with your caregiver for further evaluation. CAUSES   Heartburn.  Pneumonia or bronchitis.  Anxiety or stress.  Inflammation around your heart (pericarditis) or lung (pleuritis or pleurisy).  A blood clot in the lung.  A collapsed lung (pneumothorax). It can develop suddenly on its own (spontaneous pneumothorax) or from injury (trauma) to the chest.  Shingles infection (herpes zoster virus). The chest wall is composed of bones, muscles, and cartilage. Any of these can be the source of the pain.  The bones can be bruised by injury.  The muscles or cartilage can be strained by coughing or overwork.  The cartilage can be affected by inflammation and become sore (costochondritis). DIAGNOSIS  Lab tests or other studies, such as X-rays, electrocardiography, stress testing, or cardiac imaging, may be needed to find the cause of your pain.  TREATMENT   Treatment depends on what may be causing your chest pain. Treatment may include:  Acid blockers for heartburn.  Anti-inflammatory medicine.  Pain medicine for inflammatory conditions.  Antibiotics if an infection is present.  You may be advised to change lifestyle habits. This includes stopping smoking and avoiding alcohol, caffeine, and chocolate.  You may be advised to keep your head raised (elevated) when sleeping. This reduces the chance of acid going backward from your stomach into your esophagus.  Most of the time, nonspecific chest pain will improve within 2 to 3 days with rest and mild pain medicine. HOME CARE INSTRUCTIONS   If antibiotics were prescribed, take your antibiotics as directed. Finish them even if you start to feel better.  For the next few days, avoid physical activities that bring on chest pain. Continue physical activities as directed.  Do not smoke.  Avoid drinking alcohol.  Only take over-the-counter or  prescription medicine for pain, discomfort, or fever as directed by your caregiver.  Follow your caregiver's suggestions for further testing if your chest pain does not go away.  Keep any follow-up appointments you made. If you do not go to an appointment, you could develop lasting (chronic) problems with pain. If there is any problem keeping an appointment, you must call to reschedule. SEEK MEDICAL CARE IF:   You think you are having problems from the medicine you are taking. Read your medicine instructions carefully.  Your chest pain does not go away, even after treatment.  You develop a rash with blisters on your chest. SEEK IMMEDIATE MEDICAL CARE IF:   You have increased chest pain or pain that spreads to your arm, neck, jaw, back, or abdomen.  You develop shortness of breath, an increasing cough, or you are coughing up blood.  You have severe back or abdominal pain, feel nauseous, or vomit.  You develop severe weakness, fainting, or chills.  You have a fever. THIS IS AN EMERGENCY. Do not wait to see if the pain will go away. Get medical help at once. Call your local emergency services (911 in [place] Do not drive yourself to the hospital. MAKE SURE  YOU:   Understand these instructions.  Will watch your condition.  Will get help right away if you are not doing well or get worse. Document Released: 03/08/2005 Document Revised: 08/21/2011 Document Reviewed: 01/02/2008 [facility] Patient Information 2014 [place].

## 2013-06-27 NOTE — ED Notes (Signed)
Pt c/o CP That radiates to her back, and pain in all 4 extremities.

## 2014-04-02 ENCOUNTER — Ambulatory Visit: Payer: Self-pay | Admitting: Medical

## 2014-04-13 ENCOUNTER — Encounter (HOSPITAL_COMMUNITY): Payer: Self-pay | Admitting: Emergency Medicine

## 2014-04-14 ENCOUNTER — Encounter: Payer: Self-pay | Admitting: Medical

## 2014-06-03 LAB — OB RESULTS CONSOLE GC/CHLAMYDIA
Chlamydia: NEGATIVE
Gonorrhea: NEGATIVE

## 2014-06-12 NOTE — L&D Delivery Note (Signed)
Delivery Note At 9:47 AM a viable female, "Patricia Allen", was delivered via Vaginal, Spontaneous Delivery (Presentation: Left Occiput Anterior).  APGAR: 9, 9;  weight  .   Placenta status: Intact, Spontaneous.  Cord: 3 vessels with the following complications: CAN x 1, loose.  Cord pH: NA  Anesthesia: None  Episiotomy: None Lacerations:  None Suture Repair: NA Est. Blood Loss (mL): 300  Mom to postpartum.  Baby to Couplet care / Skin to Skin. Patient plans outpatient circumcision.  Gonsalo Cuthbertson 09/24/2014, 10:06 AM

## 2014-07-13 LAB — OB RESULTS CONSOLE RPR: RPR: NONREACTIVE

## 2014-07-13 LAB — OB RESULTS CONSOLE GBS: STREP GROUP B AG: NEGATIVE

## 2014-07-13 LAB — OB RESULTS CONSOLE HIV ANTIBODY (ROUTINE TESTING): HIV: NONREACTIVE

## 2014-09-24 ENCOUNTER — Encounter (HOSPITAL_COMMUNITY): Payer: Self-pay | Admitting: *Deleted

## 2014-09-24 ENCOUNTER — Inpatient Hospital Stay (HOSPITAL_COMMUNITY)
Admission: AD | Admit: 2014-09-24 | Discharge: 2014-09-26 | DRG: 775 | Disposition: A | Discharge status: Home / Self Care | Payer: Medicaid Other | Source: Ambulatory Visit | Attending: Obstetrics and Gynecology | Admitting: Obstetrics and Gynecology

## 2014-09-24 ENCOUNTER — Inpatient Hospital Stay (HOSPITAL_COMMUNITY): Payer: Medicaid Other

## 2014-09-24 DIAGNOSIS — Z9889 Other specified postprocedural states: Secondary | ICD-10-CM

## 2014-09-24 DIAGNOSIS — Z6833 Body mass index (BMI) 33.0-33.9, adult: Secondary | ICD-10-CM

## 2014-09-24 DIAGNOSIS — O288 Other abnormal findings on antenatal screening of mother: Secondary | ICD-10-CM

## 2014-09-24 DIAGNOSIS — Z3483 Encounter for supervision of other normal pregnancy, third trimester: Secondary | ICD-10-CM | POA: Diagnosis present

## 2014-09-24 DIAGNOSIS — Z6836 Body mass index (BMI) 36.0-36.9, adult: Secondary | ICD-10-CM

## 2014-09-24 DIAGNOSIS — O0930 Supervision of pregnancy with insufficient antenatal care, unspecified trimester: Secondary | ICD-10-CM

## 2014-09-24 DIAGNOSIS — Z3A38 38 weeks gestation of pregnancy: Secondary | ICD-10-CM | POA: Diagnosis present

## 2014-09-24 DIAGNOSIS — Z789 Other specified health status: Secondary | ICD-10-CM | POA: Diagnosis present

## 2014-09-24 LAB — TYPE AND SCREEN
ABO/RH(D): O POS
Antibody Screen: NEGATIVE

## 2014-09-24 LAB — CBC
HCT: 36.8 % (ref 36.0–46.0)
HEMOGLOBIN: 12.8 g/dL (ref 12.0–15.0)
MCH: 29.4 pg (ref 26.0–34.0)
MCHC: 34.8 g/dL (ref 30.0–36.0)
MCV: 84.6 fL (ref 78.0–100.0)
Platelets: 185 10*3/uL (ref 150–400)
RBC: 4.35 MIL/uL (ref 3.87–5.11)
RDW: 14.7 % (ref 11.5–15.5)
WBC: 8.7 10*3/uL (ref 4.0–10.5)

## 2014-09-24 LAB — RPR: RPR Ser Ql: NONREACTIVE

## 2014-09-24 LAB — ABO/RH: ABO/RH(D): O POS

## 2014-09-24 MED ORDER — CITRIC ACID-SODIUM CITRATE 334-500 MG/5ML PO SOLN: 30.0000 mL | ORAL | Status: DC | PRN

## 2014-09-24 MED ORDER — ACETAMINOPHEN 325 MG PO TABS: 650.0000 mg | ORAL_TABLET | ORAL | Status: DC | PRN

## 2014-09-24 MED ORDER — ACETAMINOPHEN 325 MG PO TABS
650.0000 mg | ORAL_TABLET | ORAL | Status: DC | PRN
  Administered 2014-09-24: 650 mg via ORAL
  Filled 2014-09-24: qty 2

## 2014-09-24 MED ORDER — IBUPROFEN 600 MG PO TABS
600.0000 mg | ORAL_TABLET | Freq: Four times a day (QID) | ORAL | Status: DC
  Administered 2014-09-24 – 2014-09-26 (×8): 600 mg via ORAL
  Filled 2014-09-24 (×8): qty 1

## 2014-09-24 MED ORDER — LIDOCAINE HCL (PF) 1 % IJ SOLN
30.0000 mL | INTRAMUSCULAR | Status: DC | PRN
  Filled 2014-09-24: qty 30

## 2014-09-24 MED ORDER — LANOLIN HYDROUS EX OINT: TOPICAL_OINTMENT | CUTANEOUS | Status: DC | PRN

## 2014-09-24 MED ORDER — ONDANSETRON HCL 4 MG PO TABS: 4.0000 mg | ORAL_TABLET | ORAL | Status: DC | PRN

## 2014-09-24 MED ORDER — ONDANSETRON HCL 4 MG/2ML IJ SOLN: 4.0000 mg | INTRAMUSCULAR | Status: DC | PRN

## 2014-09-24 MED ORDER — OXYTOCIN 40 UNITS IN LACTATED RINGERS INFUSION - SIMPLE MED
62.5000 mL/h | INTRAVENOUS | Status: DC
  Administered 2014-09-24: 62.5 mL/h via INTRAVENOUS
  Filled 2014-09-24: qty 1000

## 2014-09-24 MED ORDER — OXYCODONE-ACETAMINOPHEN 5-325 MG PO TABS: 2.0000 | ORAL_TABLET | ORAL | Status: DC | PRN

## 2014-09-24 MED ORDER — BUTORPHANOL TARTRATE 1 MG/ML IJ SOLN
1.0000 mg | INTRAMUSCULAR | Status: DC | PRN
  Administered 2014-09-24: 1 mg via INTRAVENOUS

## 2014-09-24 MED ORDER — WITCH HAZEL-GLYCERIN EX PADS: 1.0000 "application " | MEDICATED_PAD | CUTANEOUS | Status: DC | PRN

## 2014-09-24 MED ORDER — OXYTOCIN 40 UNITS IN LACTATED RINGERS INFUSION - SIMPLE MED
1.0000 m[IU]/min | INTRAVENOUS | Status: DC
  Administered 2014-09-24: 3 m[IU]/min via INTRAVENOUS
  Administered 2014-09-24: 1 m[IU]/min via INTRAVENOUS

## 2014-09-24 MED ORDER — OXYTOCIN BOLUS FROM INFUSION: 500.0000 mL | INTRAVENOUS | Status: DC

## 2014-09-24 MED ORDER — LACTATED RINGERS IV SOLN: 500.0000 mL | INTRAVENOUS | Status: DC | PRN

## 2014-09-24 MED ORDER — FLEET ENEMA 7-19 GM/118ML RE ENEM: 1.0000 | ENEMA | RECTAL | Status: DC | PRN

## 2014-09-24 MED ORDER — OXYCODONE-ACETAMINOPHEN 5-325 MG PO TABS
1.0000 | ORAL_TABLET | ORAL | Status: DC | PRN
  Administered 2014-09-25 – 2014-09-26 (×2): 1 via ORAL
  Filled 2014-09-24 (×2): qty 1

## 2014-09-24 MED ORDER — OXYCODONE-ACETAMINOPHEN 5-325 MG PO TABS: 1.0000 | ORAL_TABLET | ORAL | Status: DC | PRN

## 2014-09-24 MED ORDER — TERBUTALINE SULFATE 1 MG/ML IJ SOLN
0.2500 mg | Freq: Once | INTRAMUSCULAR | Status: DC | PRN
  Filled 2014-09-24: qty 1

## 2014-09-24 MED ORDER — LACTATED RINGERS IV SOLN
INTRAVENOUS | Status: DC
  Administered 2014-09-24: 07:00:00 via INTRAUTERINE

## 2014-09-24 MED ORDER — DIPHENHYDRAMINE HCL 25 MG PO CAPS
25.0000 mg | ORAL_CAPSULE | Freq: Four times a day (QID) | ORAL | Status: DC | PRN
Start: 2014-09-24 — End: 2014-09-26

## 2014-09-24 MED ORDER — ZOLPIDEM TARTRATE 5 MG PO TABS: 5.0000 mg | ORAL_TABLET | Freq: Every evening | ORAL | Status: DC | PRN

## 2014-09-24 MED ORDER — BENZOCAINE-MENTHOL 20-0.5 % EX AERO
1.0000 "application " | INHALATION_SPRAY | CUTANEOUS | Status: DC | PRN
  Administered 2014-09-24: 1 via TOPICAL
  Filled 2014-09-24: qty 56

## 2014-09-24 MED ORDER — TETANUS-DIPHTH-ACELL PERTUSSIS 5-2.5-18.5 LF-MCG/0.5 IM SUSP
0.5000 mL | Freq: Once | INTRAMUSCULAR | Status: AC
  Administered 2014-09-25: 0.5 mL via INTRAMUSCULAR

## 2014-09-24 MED ORDER — NALBUPHINE HCL 10 MG/ML IJ SOLN
10.0000 mg | INTRAMUSCULAR | Status: DC | PRN
  Administered 2014-09-24: 10 mg via INTRAVENOUS
  Filled 2014-09-24: qty 1

## 2014-09-24 MED ORDER — SIMETHICONE 80 MG PO CHEW: 80.0000 mg | CHEWABLE_TABLET | ORAL | Status: DC | PRN

## 2014-09-24 MED ORDER — BUTORPHANOL TARTRATE 1 MG/ML IJ SOLN
INTRAMUSCULAR | Status: AC
  Filled 2014-09-24: qty 1

## 2014-09-24 MED ORDER — ONDANSETRON HCL 4 MG/2ML IJ SOLN: 4.0000 mg | Freq: Four times a day (QID) | INTRAMUSCULAR | Status: DC | PRN

## 2014-09-24 MED ORDER — LACTATED RINGERS IV SOLN
INTRAVENOUS | Status: DC
  Administered 2014-09-24: 04:00:00 via INTRAVENOUS

## 2014-09-24 MED ORDER — DIBUCAINE 1 % RE OINT: 1.0000 "application " | TOPICAL_OINTMENT | RECTAL | Status: DC | PRN

## 2014-09-24 MED ORDER — PRENATAL MULTIVITAMIN CH
1.0000 | ORAL_TABLET | Freq: Every day | ORAL | Status: DC
  Administered 2014-09-25: 1 via ORAL
  Filled 2014-09-24: qty 1

## 2014-09-24 MED ORDER — SENNOSIDES-DOCUSATE SODIUM 8.6-50 MG PO TABS
2.0000 | ORAL_TABLET | ORAL | Status: DC
  Administered 2014-09-25 (×2): 2 via ORAL
  Filled 2014-09-24 (×2): qty 2

## 2014-09-24 NOTE — Progress Notes (Signed)
UR chart review completed.  

## 2014-09-24 NOTE — Lactation Note (Signed)
This note was copied from the chart of Patricia Allen Collison. Lactation Consultation Note Initial visit at 12 hours of age.  Mom reports baby is a little gaggy right now.  Mom reports baby has breastfed and she has nipple pain.  Offered to assist with hand expression and mom declines due to feeling sore already.  Discussed with mom hand expression prior to a feeding can help.  Discussed a wide open mouth for deep latch.  Discussed exclusively breastfeeding benefits.  Mom is not engaged well with visit.  LC to follow up later.  Report to RN who reports has asked mom to call for latch assist and mom has not.    Patient Name: Patricia Allen Udovich [id number]'s Date: 09/24/2014 Reason for consult: Initial assessment   Maternal Data Has patient been taught Hand Expression?:  (mom declines assist at this time) Does the patient have breastfeeding experience prior to this delivery?: Yes  Feeding Feeding Type: Breast Fed Length of feed: 0 min  LATCH Score/Interventions                      Lactation Tools Discussed/Used WIC Program: No   Consult Status Consult Status: Follow-up Date: 09/25/14 Follow-up type: In-patient    Jannifer RodneyShoptaw, Deborah Lazcano Lynn 09/24/2014, 10:38 PM

## 2014-09-24 NOTE — Discharge Summary (Signed)
  Vaginal Delivery Discharge Summary  Patricia Allen  DOB:    08/13/1985 MRN:    [id number] CSN:    [id number]  Date of admission:                  09/24/14  Date of discharge:                   09/26/14  Procedures this admission:   SVB  Date of Delivery: 09/24/14  Newborn Data:  Live born female  Birth Weight:   APGAR: 9, 9  Home with mother. Name: Patricia Allen Circumcision Plan: Outpatient  History of Present Illness:  Patricia Allen is a 29 y.o. female, G3P2002, who presents at [id number] weeks gestation. [facility] She was admitted onset of labor. Her pregnancy has been complicated by:  Patient Active Problem List   Diagnosis Date Noted  . Language barrier 09/24/2014  . Late prenatal care 09/24/2014  . History of colposcopy with cervical biopsy - 06/2009 09/24/2014  . BMI 36.0-36.9,adult 09/24/2014  . Vaginal delivery 09/24/2014     Hospital Course:  Admitted 09/24/14 in early labor. Negative GBS. Progressed with AROM and minimal pitocin augmentation. Utilized IV meds for pain management.  Delivery was performed by Nigel BridgemanVicki Tedi Hughson, CNM, without complication. Patient and baby tolerated the procedure without difficulty, with no laceration noted. Infant status was stable and remained in room with mother.  Mother and infant then had an uncomplicated postpartum course, with breast feeding going well. Mom's physical exam was WNL, and she was discharged home in stable condition. Contraception plan was undecided at the time of d/c.  She received adequate benefit from po pain medications, using Motrin and Percocet with benefit.   Feeding:  breast  Contraception:  Undecided  Hemoglobin Results:  CBC Latest Ref Rng 09/25/2014 09/24/2014 06/27/2013  WBC 4.0 - 10.5 K/uL 12.6(H) 8.7 8.6  Hemoglobin 12.0 - 15.0 g/dL 11.0(L) 12.8 12.6  Hematocrit 36.0 - 46.0 % 32.8(L) 36.8 37.5   Platelets 150 - 400 K/uL 172 185 296     Discharge Physical Exam:   General: alert Lochia: appropriate Uterine Fundus: firm Incision: NA--perineum intact DVT Evaluation: No evidence of DVT seen on physical exam. Negative Homan's sign.  Intrapartum Procedures: spontaneous vaginal delivery Postpartum Procedures: none Complications-Operative and Postpartum: none  Discharge Diagnoses: Term Pregnancy-delivered  Discharge Information:  Activity:           pelvic rest Diet:                routine Medications: Ibuprofen, Iron and Percocet Condition:      stable Instructions:  Routine pp   Discharge to: home  Follow-up Information    [facility]. Schedule an appointment as soon as possible for a visit in 6 weeks.   Specialty:  Obstetrics and Gynecology   Why:  Call for any questions or concerns.   Contact information:   [address] [place] [id number] [phone]       Nigel BridgemanLATHAM, Abri Vacca [facility] 09/26/2014 9:14 AM

## 2014-09-24 NOTE — H&P (Signed)
Patricia Allen is a 29 y.o. female, G3P2002 at 38.6 weeks, presenting for ctxs less than 5 min apart x 2 days, worse in intensity over past hour. Cvx 3/50/-3 on arrival, progressing to 4/70/-3 after BPP. Did not ambulate post BPP due to increased pain level. Reports active fetus and brownish d/c, but no active bleeding or LOF.  Does not desire epidural due to "back problems" from epidural in 2011. Undecided re: IV pain med; thinks she may want to do NCB.  Speaks limited [place], primary language = [place] - spouse at bedside interpreting what she does not understand.  Patient Active Problem List   Diagnosis Date Noted  . Anemia 09/24/2014  . Language barrier 09/24/2014  . BMI 35.0-35.9,adult 09/24/2014  . Late prenatal care 09/24/2014  . Normal labor 09/24/2014  . History of abnormal cervical Papanicolaou smear x 1 - normal on 06/09/14 09/24/2014  . History of colposcopy with cervical biopsy - 06/2009 09/24/2014  . Non-reactive NST (non-stress test) - BPP 8/8 09/24/2014  . BMI 36.0-36.9,adult 09/24/2014    History of present pregnancy: Patient entered care at 23.4 weeks.   EDC of 10/02/14 was established by sure LMP.   Anatomy scan: 24.5 weeks, with normal findings and a posterior placenta.   Additional US evaluations: None.   Significant prenatal events: Late entry to care. Treated for 100K / ENTEROCOCCUS SPECIES, TOC neg. No MAU visits. TWG 9 lbs. No documented Tdap or flu vaccine. Last evaluation: Office 09/21/14 by SDJ, CNM at 38.3 wks. BP 94/60.  OB History    Gravida Para Term Preterm AB TAB SAB Ectopic Multiple Living   3 2 2       2     SVD on 12/20/09 @ 40 wks, female infant, birthwt 7 lbs, Epidural, WHG, "back problems" from epidural per pt report "Jomarie LongsJoseph" SVD on 04/30/11 @ 40 wks, female infant, birthwt 7 lbs, no IV med or Epidural, WHG, no complications "Gavin Poundeborah" Past Medical History  Diagnosis Date  . No pertinent past medical history   . Abnormal Pap smear     Resolved as  of last pap.  Colpo 1/11   Past Surgical History  Procedure Laterality Date  . No past surgeries     Family History: family history includes Hypertension in her maternal grandmother and paternal grandmother. Social History:  reports that she has never smoked. She has never used smokeless tobacco. 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 She is married to MeadWestvacollado Kokougan who is supportive and serves as her interpreter. She is of the [place] faith.    Prenatal Transfer Tool  Maternal Diabetes: No - abnormal 1hr; normal 3hr Genetic Screening: Missed window Maternal Ultrasounds/Referrals: Normal Fetal Ultrasounds or other Referrals:  None Maternal Substance Abuse:  No Significant Maternal Medications:  Meds include: Other: PNVs, Iron Significant Maternal Lab Results: Lab values include: Group B Strep negative  TDAP: No Flu: No  ROS: +FM, +Ctxs, -VB, -LOF  Allergies  Allergen Reactions  . Apple Itching   Dilation: 4.5 Effacement (%): 100 Station: -1 Exam by:: Caryl NeverKim Dearion Huot CNM  Today's Vitals   09/24/14 0414 09/24/14 0436 09/24/14 0452 09/24/14 0524  BP:   110/61   Pulse:   102   Temp:   97.9 F (36.6 C)   TempSrc:   Oral   Resp:   16   Height: 5\' 2"  (1.575 m)  Weight: 195 lb (88.451 kg)     PainSc:  6   4    Gen: Obvious discomfort in hands and knees position, rocking Chest clear Heart RRR without murmur Abd gravid, NT, FH CWD Pelvic: 4/70/-3, ballotable, BBOW - too high for amniotomy Cephalic by Leopolds and VE Bishop score: 8 EFW: 7 lbs Ext: WNL  FHR: BL 135 w/ min variability, - accels, mild-mod variables  UCs: Irregular, q 2-4 min  Prenatal labs: ABO, Rh: O+ (05/29/14) Antibody: Neg (05/29/14) Rubella: Immune (05/29/14) RPR: NR (12/18 @ 22 wks & on 12/29) HBsAg: Neg (05/29/14)  HIV: Neg (12/18 & 12/29) GBS: Neg (09/02/14) Sickle cell/Hgb  electrophoresis: Normal study Pap: Neg on 06/09/14 GC: Neg on 05/29/14 Chlamydia: Neg on 05/29/14 Genetic screenings: Missed window Glucola: Abnormal at 137, normal 3hr   Hgb 10.5 on 05/29/14 and 10.1 on 07/10/14  Results for orders placed or performed during the hospital encounter of 09/24/14 (from the past 24 hour(s))  CBC     Status: None   Collection Time: 09/24/14  4:00 AM  Result Value Ref Range   WBC 8.7 4.0 - 10.5 K/uL   RBC 4.35 3.87 - 5.11 MIL/uL   Hemoglobin 12.8 12.0 - 15.0 g/dL   HCT 16.1 09.6 - 04.5 %   MCV 84.6 78.0 - 100.0 fL   MCH 29.4 26.0 - 34.0 pg   MCHC 34.8 30.0 - 36.0 g/dL   RDW 40.9 81.1 - 91.4 %   Platelets 185 150 - 400 K/uL  Type and screen     Status: None   Collection Time: 09/24/14  4:00 AM  Result Value Ref Range   ABO/RH(D) O POS    Antibody Screen NEG    Sample Expiration 09/27/2014    Assessment: IUP at 38.6 wks Early labor - steady progression Non-reactive NST on arrival - BPP 8/8; remains Cat 2 FHRT BBOW GBS neg Elevated BMI (36)   Plan: Admit to Birthing Suite Intrauterine resuscitative measures prn Routine [facility] orders Pain med/epidural prn Expectant management for now Consult prn Expect SVD  Sherre Scarlet CNM, MS 09/24/2014, 6:30 AM

## 2014-09-24 NOTE — Plan of Care (Signed)
Problem: Consults Goal: Birthing Suites Patient Information Press F2 to bring up selections list Outcome: Completed/Met Date Met:  09/24/14  Pt 37-[redacted] weeks EGA

## 2014-09-24 NOTE — MAU Provider Note (Signed)
  History  29 yo G3P2002 @ 38.6 wks presents to MAU unannounced w/ c/o ctxs, less than 5 min apart since yesterday. Reports some brownish discharge, but no active bleeding. +FM. Denies LOF. States was FT in office this past Monday.  Limited [place] - [place] speaking - spouse at bedside interpreting what she does not understand.  Patient Active Problem List   Diagnosis Date Noted  . Anemia 09/24/2014  . Language barrier 09/24/2014  . BMI 34.0-34.9,adult 09/24/2014  . Late prenatal care 09/24/2014    No chief complaint on file.  HPI As above OB History    Gravida Para Term Preterm AB TAB SAB Ectopic Multiple Living   3 2 2       2       Past Medical History  Diagnosis Date  . No pertinent past medical history   . Abnormal Pap smear     Resolved as of last pap.  Colpo 1/11    Past Surgical History  Procedure Laterality Date  . No past surgeries      Family History  Problem Relation Age of Onset  . Hypertension Maternal Grandmother   . Hypertension Paternal Grandmother     History  Substance Use Topics  . Smoking status: Never Smoker   . Smokeless tobacco: Never Used  . Alcohol Use: No    Allergies:  Allergies  Allergen Reactions  . Apple Itching    Prescriptions prior to admission  Medication Sig Dispense Refill Last Dose  . ferrous fumarate (HEMOCYTE - 106 MG FE) 325 (106 FE) MG TABS tablet Take 1 tablet by mouth.   09/23/2014 at Unknown time  . prenatal vitamin w/FE, FA (NATACHEW) 29-1 MG CHEW chewable tablet Chew 1 tablet by mouth daily at 12 noon.   09/23/2014 at Unknown time  . ibuprofen (ADVIL,MOTRIN) 200 MG tablet Take 200 mg by mouth every 6 (six) hours as needed for moderate pain.   More than a month at Unknown time  . ibuprofen (ADVIL,MOTRIN) 800 MG tablet Take 1 tablet (800 mg total) by mouth every 8 (eight) hours as needed. Take with food. 30 tablet 0 More than a month at Unknown time    ROS  +FM +Ctxs -VB -LOF Physical Exam   There were no  vitals taken for this visit.    Physical Exam Gen: Mod distress Abdomen: gravid, soft between contractions, no rebound or guarding Pelvic: 3/50/-3 - ballotable, Intact membranes FHRT: Reasuring but not reactive U/C's:q 3-7 min ED Course  Assessment: Non-reactive NST  Plan: BPP Desires to ambulate after BPP if at all possible   Sherre ScarletWILLIAMS, Breezie Micucci CNM, MS 09/24/2014 3:18 AM

## 2014-09-24 NOTE — Progress Notes (Addendum)
  Subjective: Received one dose of Nubain at 04:24 AM w/ relief. Spouse at bedside. Desires AROM.  Objective: BP 110/61 mmHg  Pulse 102  Temp(Src) 97.9 F (36.6 C) (Oral)  Resp 16  Ht 5\' 2"  (1.575 m)  Wt 195 lb (88.451 kg)  BMI 35.66 kg/m2     Filed Vitals:   09/24/14 0452  BP: 110/61  Pulse: 102  Temp: 97.9 F (36.6 C)  Resp: 16    FHT: BL 145 w/ min variability, occ accel, mild-mod variables, earlys UC:   regular, every 4 minutes SVE: 4/90-100/-1/-2 AROM'd, large amount of mod MSF (06:00 AM) FSE placed due to inability to trace FHR adequately. IUPC for amnioinfusion and to monitor ctxs.  Assessment:  Cat 2 FHRT  GBS neg MSF  Plan: Begin Amnioinfusion - explained rationale to couple who concurs w/ plan Continue existing plan Defer next eval to oncoming CNM Expect progress and SVD  Sherre ScarletWILLIAMS, Suhayla Chisom CNM 09/24/2014, 6:17 AM

## 2014-09-24 NOTE — MAU Note (Signed)
Contractions, some bleeding.  

## 2014-09-24 NOTE — Progress Notes (Signed)
  Subjective: Feeling pressure.  Received Stadol at 0802, some benefit.  Objective: BP 120/68 mmHg  Pulse 85  Temp(Src) 98.7 F (37.1 C) (Oral)  Resp 20  Ht 5\' 2"  (1.575 m)  Wt 195 lb (88.451 kg)  BMI 35.66 kg/m2      FHT: Category 2--variable decels with UCs, moderate variability UC:   irregular, every 2-5 minutes SVE:   Dilation: 7.5 Effacement (%): 100 Station: -1 Exam by:: Mery Guadalupe--exam unchanged. Pitocin at 1 mu/min  MVUs 110.  Assessment:  Active labor Inadequate contractions  Plan: Continue pitocin augmentation. Pain med prn.  Nigel BridgemanLATHAM, Rebacca Votaw CNM 09/24/2014, 8:45a

## 2014-09-24 NOTE — Progress Notes (Addendum)
  Subjective: Feeling pain with UCs, coping, husband at bedside.  Minimal benefit from Nubain dose at 0424.  Objective: BP 127/80 mmHg  Pulse 83  Temp(Src) 98.1 F (36.7 C) (Oral)  Resp 18  Ht 5\' 2"  (1.575 m)  Wt 195 lb (88.451 kg)  BMI 35.66 kg/m2     FHT: Category 2--decreased LT variability, adequate ST variability, mild variables with UCs FSE and IUPC in place UC:   irregular, every 2-5 minutes SVE:   7 cm, 80%, vtx, -1 Amnioinfusion in place MVUs 120  Leaking fluid, light MSF at present.  Assessment:  Progressive labor, but irregular contraction pattern Category 2 FHR  Plan: Pitocin augmentation--patient and husband agreeable with plan for augmentation. Stadol now. Close observation of FHR status. Anticipate SVB.  Nigel BridgemanLATHAM, Anel Purohit CNM 09/24/2014, 7:58 AM

## 2014-09-25 LAB — CBC
HEMATOCRIT: 32.8 % — AB (ref 36.0–46.0)
Hemoglobin: 11 g/dL — ABNORMAL LOW (ref 12.0–15.0)
MCH: 28.9 pg (ref 26.0–34.0)
MCHC: 33.5 g/dL (ref 30.0–36.0)
MCV: 86.3 fL (ref 78.0–100.0)
PLATELETS: 172 10*3/uL (ref 150–400)
RBC: 3.8 MIL/uL — ABNORMAL LOW (ref 3.87–5.11)
RDW: 14.9 % (ref 11.5–15.5)
WBC: 12.6 10*3/uL — AB (ref 4.0–10.5)

## 2014-09-25 NOTE — Progress Notes (Signed)
Subjective: Postpartum Day 1: Vaginal delivery, no laceration Patient up ad lib, reports no syncope or dizziness. Feeding:  Breast/bottle Contraceptive plan:  Undecided at present  Objective: Vital signs in last 24 hours: Temp:  [97.9 F (36.6 C)-99.3 F (37.4 C)] 98 F (36.7 C) (04/15 0540) Pulse Rate:  [71-103] 82 (04/15 0540) Resp:  [18-20] 18 (04/15 0540) BP: (103-131)/(48-92) 105/66 mmHg (04/15 0540) SpO2:  [100 %] 100 % (04/14 1738)  Physical Exam:  General: alert Lochia: appropriate Uterine Fundus: firm Perineum: Intact DVT Evaluation: No evidence of DVT seen on physical exam. Negative Homan's sign.  CBC Latest Ref Rng 09/25/2014 09/24/2014 06/27/2013  WBC 4.0 - 10.5 K/uL 12.6(H) 8.7 8.6  Hemoglobin 12.0 - 15.0 g/dL 11.0(L) 12.8 12.6  Hematocrit 36.0 - 46.0 % 32.8(L) 36.8 37.5  Platelets 150 - 400 K/uL 172 185 296    Assessment/Plan: Status post vaginal delivery day 1 Stable Continue current care. Plan for discharge tomorrow    Nyra CapesLATHAM, VICKICNM 09/25/2014, 7:36 AM

## 2014-09-25 NOTE — Lactation Note (Signed)
This note was copied from the chart of Boy Mauri PoleDiane Rizzi. Lactation Consultation Note: mother states that infant  doesn't have a strong suck like other children. Mother also states that she has no milk. Lots of teaching with mother. Reviewed hand expression. Attempt with latch, infant very sleepy. Advised mother to page to check latch. Mother advised to do frequent STS. Reviewed good latch with proper positioning. Reviewed baby and me book.   Patient Name: Boy Mauri PoleDiane Liddicoat ZOXWR'UToday's Date: 09/25/2014 Reason for consult: Follow-up assessment   Maternal Data    Feeding Feeding Type: Breast Fed Length of feed: 12 min  LATCH Score/Interventions                      Lactation Tools Discussed/Used     Consult Status Consult Status: Follow-up Date: 09/25/14 Follow-up type: In-patient    Stevan BornKendrick, Marlet Korte [facility] 09/25/2014, 12:13 PM

## 2014-09-25 NOTE — Lactation Note (Signed)
This note was copied from the chart of Patricia Allen Rynders. Lactation Consultation Note: staff nurse ask to follow up with mother to observed feeding . Infant 25 hours of life and had not had a wet diaper. Infants diaper checked when I entered the room and observed large wet diaper. Photographer in room  And mother to page when infant has next feeding.   Patient Name: Patricia Allen Ninneman WUJWJ'XToday's Date: 09/25/2014 Reason for consult: Follow-up assessment   Maternal Data    Feeding Feeding Type: Breast Fed Length of feed: 0 min (baby sleepy)  LATCH Score/Interventions                      Lactation Tools Discussed/Used     Consult Status      Michel BickersKendrick, Tram Wrenn [facility] 09/25/2014, 11:17 AM

## 2014-09-26 MED ORDER — OXYCODONE-ACETAMINOPHEN 5-325 MG PO TABS: 1.0000 | ORAL_TABLET | ORAL | Status: DC | PRN

## 2014-09-26 MED ORDER — IBUPROFEN 600 MG PO TABS: 600.0000 mg | ORAL_TABLET | Freq: Four times a day (QID) | ORAL | Status: DC | PRN

## 2014-09-26 NOTE — Lactation Note (Signed)
This note was copied from the chart of Patricia Mauri PoleDiane Batiz. Lactation Consultation Note Experienced BF mom w/large everted nipples and large breast has transitional milk, easily expressed and squirted out when hand expressed. Encouraged deep latch, and breast massage during feeding to express milk into baby's mouth at intervals. Encouraged to document I&O.  Baby had several emesis and could had contributed to some weight loss.  Discussed supply and demand, positions, hearing swallows, deep latches verses shallow latches, milk transfer, expected I&O, and engorgement.  Reported to nursery of moms good milk supply and suggestions. Patient Name: Patricia Allen WUJWJ'XToday's Date: 09/26/2014 Reason for consult: Follow-up assessment   Maternal Data    Feeding Feeding Type: Breast Fed Length of feed: 10 min (still BF)  LATCH Score/Interventions Latch: Grasps breast easily, tongue down, lips flanged, rhythmical sucking. Intervention(s): Breast massage;Breast compression  Audible Swallowing: Spontaneous and intermittent Intervention(s): Hand expression  Type of Nipple: Everted at rest and after stimulation  Comfort (Breast/Nipple): Soft / non-tender     Hold (Positioning): No assistance needed to correctly position infant at breast.  LATCH Score: 10  Lactation Tools Discussed/Used     Consult Status Consult Status: Complete Date: 09/26/14    Charyl DancerCARVER, [name] 09/26/2014, 10:12 AM

## 2014-09-26 NOTE — Discharge Instructions (Signed)

## 2015-07-07 ENCOUNTER — Other Ambulatory Visit: Payer: Self-pay | Admitting: Obstetrics and Gynecology

## 2015-07-07 DIAGNOSIS — R947 Abnormal results of other endocrine function studies: Secondary | ICD-10-CM

## 2015-07-14 ENCOUNTER — Ambulatory Visit
Admission: RE | Admit: 2015-07-14 | Discharge: 2015-07-14 | Disposition: A | Discharge status: Home / Self Care | Payer: BLUE CROSS/BLUE SHIELD | Source: Ambulatory Visit | Attending: Obstetrics and Gynecology | Admitting: Obstetrics and Gynecology

## 2015-07-14 DIAGNOSIS — R947 Abnormal results of other endocrine function studies: Secondary | ICD-10-CM

## 2015-07-14 MED ORDER — GADOBENATE DIMEGLUMINE 529 MG/ML IV SOLN
8.0000 mL | Freq: Once | INTRAVENOUS | Status: AC | PRN
  Administered 2015-07-14: 8 mL via INTRAVENOUS

## 2015-07-27 ENCOUNTER — Telehealth: Payer: Self-pay | Admitting: Medical

## 2015-07-27 NOTE — Telephone Encounter (Signed)
Called pt and left message for her to call to schedule a CPE per Vincenza Hews

## 2015-08-10 ENCOUNTER — Ambulatory Visit (INDEPENDENT_AMBULATORY_CARE_PROVIDER_SITE_OTHER): Payer: BLUE CROSS/BLUE SHIELD | Admitting: Medical

## 2015-08-10 ENCOUNTER — Encounter: Payer: Self-pay | Admitting: Medical

## 2015-08-10 VITALS — BP 100/68 | HR 76 | Ht 64.0 in | Wt 176.0 lb

## 2015-08-10 DIAGNOSIS — M549 Dorsalgia, unspecified: Secondary | ICD-10-CM | POA: Diagnosis not present

## 2015-08-10 DIAGNOSIS — Z0000 Encounter for general adult medical examination without abnormal findings: Secondary | ICD-10-CM

## 2015-08-10 DIAGNOSIS — Z13220 Encounter for screening for lipoid disorders: Secondary | ICD-10-CM | POA: Insufficient documentation

## 2015-08-10 DIAGNOSIS — G8929 Other chronic pain: Secondary | ICD-10-CM | POA: Diagnosis not present

## 2015-08-10 DIAGNOSIS — Z2821 Immunization not carried out because of patient refusal: Secondary | ICD-10-CM

## 2015-08-10 DIAGNOSIS — R7989 Other specified abnormal findings of blood chemistry: Secondary | ICD-10-CM | POA: Insufficient documentation

## 2015-08-10 DIAGNOSIS — R930 Abnormal findings on diagnostic imaging of skull and head, not elsewhere classified: Secondary | ICD-10-CM | POA: Diagnosis not present

## 2015-08-10 DIAGNOSIS — R9089 Other abnormal findings on diagnostic imaging of central nervous system: Secondary | ICD-10-CM | POA: Insufficient documentation

## 2015-08-10 DIAGNOSIS — E229 Hyperfunction of pituitary gland, unspecified: Secondary | ICD-10-CM | POA: Diagnosis not present

## 2015-08-10 LAB — CBC
HEMATOCRIT: 38 % (ref 36.0–46.0)
Hemoglobin: 12.1 g/dL (ref 12.0–15.0)
MCH: 26.7 pg (ref 26.0–34.0)
MCHC: 31.8 g/dL (ref 30.0–36.0)
MCV: 83.9 fL (ref 78.0–100.0)
MPV: 10.5 fL (ref 8.6–12.4)
PLATELETS: 289 10*3/uL (ref 150–400)
RBC: 4.53 MIL/uL (ref 3.87–5.11)
RDW: 14.5 % (ref 11.5–15.5)
WBC: 4.9 10*3/uL (ref 4.0–10.5)

## 2015-08-10 LAB — POCT URINALYSIS DIPSTICK
Blood, UA: NEGATIVE
GLUCOSE UA: NEGATIVE
KETONES UA: NEGATIVE
LEUKOCYTES UA: NEGATIVE
Nitrite, UA: NEGATIVE
PROTEIN UA: NEGATIVE
Spec Grav, UA: 1.02
Urobilinogen, UA: NEGATIVE
pH, UA: 7.5

## 2015-08-10 LAB — COMPREHENSIVE METABOLIC PANEL
ALT: 8 U/L (ref 6–29)
AST: 13 U/L (ref 10–30)
Albumin: 4.2 g/dL (ref 3.6–5.1)
Alkaline Phosphatase: 77 U/L (ref 33–115)
BUN: 15 mg/dL (ref 7–25)
CALCIUM: 9.3 mg/dL (ref 8.6–10.2)
CO2: 23 mmol/L (ref 20–31)
Chloride: 104 mmol/L (ref 98–110)
Creat: 0.83 mg/dL (ref 0.50–1.10)
GLUCOSE: 84 mg/dL (ref 65–99)
POTASSIUM: 4.2 mmol/L (ref 3.5–5.3)
Sodium: 136 mmol/L (ref 135–146)
Total Bilirubin: 0.3 mg/dL (ref 0.2–1.2)
Total Protein: 6.9 g/dL (ref 6.1–8.1)

## 2015-08-10 LAB — HEMOGLOBIN A1C
HEMOGLOBIN A1C: 5.8 % — AB (ref <5.7)
MEAN PLASMA GLUCOSE: 120 mg/dL — AB (ref <117)

## 2015-08-10 LAB — TSH: TSH: 0.97 mIU/L

## 2015-08-10 LAB — LIPID PANEL
CHOL/HDL RATIO: 1.8 ratio (ref <=5.0)
Cholesterol: 124 mg/dL — ABNORMAL LOW (ref 125–200)
HDL: 70 mg/dL (ref >=46)
LDL CALC: 49 mg/dL (ref <130)
Triglycerides: 27 mg/dL (ref <150)
VLDL: 5 mg/dL (ref <30)

## 2015-08-10 NOTE — Progress Notes (Signed)
Subjective:   HPI  Patricia Allen is a 30 y.o. female who presents for a complete physical.  Here with interpreter.  She is from [place].  She is nursing, has a 4mo infant boy.   Apparently she had recent abnormal prolactin and brain MRI was done by gyn.   Was told to f/u here.   Denies any current problems.    Chief Complaint  Patient presents with  . Annual Exam    declines flu shot. vision done. urine obtained. GYN, is [place]. not fasting    Medical care team includes:  Dr. Annamarie Dawley, gyn TYSINGER, DAVID [facility], PA-C establishing care today.  Last visit here was 3 years ago for back pain.   Reviewed their medical, surgical, family, social, medication, and allergy history and updated chart as appropriate.  Past Medical History  Diagnosis Date  . No pertinent past medical history   . Abnormal Pap smear     Resolved as of last pap.  Colpo 1/11    Past Surgical History  Procedure Laterality Date  . No past surgeries      Social History   Social History  . Marital Status: Married    Spouse Name: N/A  . Number of Children: N/A  . Years of Education: N/A   Occupational History  . Not on file.   Social History Main Topics  . Smoking status: Never Smoker   . Smokeless tobacco: Never Used  . Alcohol Use: No  . Drug Use: No  . Sexual Activity: Yes     Comment: Pregnant   Other Topics Concern  . Not on file   Social History Narrative    Family History  Problem Relation Age of Onset  . Hypertension Maternal Grandmother   . Hypertension Paternal Grandmother   . Cancer Neg Hx   . Heart disease Neg Hx   . Diabetes Neg Hx      Current outpatient prescriptions:  .  ferrous fumarate (HEMOCYTE - 106 MG FE) 325 (106 FE) MG TABS tablet, Take 1 tablet by mouth., Disp: , Rfl:  .  ibuprofen (ADVIL,MOTRIN) 600 MG tablet, Take 1 tablet (600 mg total) by mouth every 6 (six) hours as needed., Disp: 30 tablet, Rfl: 2 .  oxyCODONE-acetaminophen (PERCOCET/ROXICET)  5-325 MG per tablet, Take 1 tablet by mouth every 4 (four) hours as needed (for pain scale 4-7)., Disp: 30 tablet, Rfl: 0 .  prenatal vitamin w/FE, FA (NATACHEW) 29-1 MG CHEW chewable tablet, Chew 1 tablet by mouth daily at 12 noon., Disp: , Rfl:   Allergies  Allergen Reactions  . Apple Itching     Review of Systems Constitutional: -fever, -chills, -sweats, -unexpected weight change, -decreased appetite, -fatigue Allergy: -sneezing, -itching, -congestion Dermatology: -changing moles, --rash, -lumps ENT: -runny nose, -ear pain, -sore throat, -hoarseness, -sinus pain, -teeth pain, - ringing in ears, -hearing loss, -nosebleeds Cardiology: -chest pain, -palpitations, -swelling, -difficulty breathing when lying flat, -waking up short of breath Respiratory: -cough, -shortness of breath, -difficulty breathing with exercise or exertion, -wheezing, -coughing up blood Gastroenterology: -abdominal pain, -nausea, -vomiting, -diarrhea, -constipation, -blood in stool, -changes in bowel movement, -difficulty swallowing or eating Hematology: -bleeding, -bruising  Musculoskeletal: -joint aches, -muscle aches, -joint swelling, -back pain, -neck pain, -cramping, -changes in gait Ophthalmology: denies vision changes, eye redness, itching, discharge Urology: -burning with urination, -difficulty urinating, -blood in urine, -urinary frequency, -urgency, -incontinence Neurology: -headache, -weakness, -tingling, -numbness, -memory loss, -falls, -dizziness Psychology: -depressed mood, -agitation, -sleep problems  Objective:   Physical Exam  BP 100/68 mmHg  Pulse 76  Ht  (1.626 m)  Wt 176 lb (79.833 kg)  BMI 30.20 kg/m2  LMP 07/27/2015  Breastfeeding? Yes  General appearance: alert, no distress, WD/WN, AA female Skin: few scattered macules, no worrisome lesions HEENT: normocephalic, conjunctiva/corneas normal, sclerae anicteric, PERRLA, EOMi, nares patent, no discharge or erythema, pharynx  normal Oral cavity: MMM, tongue normal, teeth in good repair Neck: supple, no lymphadenopathy, no thyromegaly, no masses, normal ROM, no bruits Chest: non tender, normal shape and expansion Heart: RRR, normal S1, S2, no murmurs Lungs: CTA bilaterally, no wheezes, rhonchi, or rales Abdomen: +bs, soft, non tender, non distended, no masses, no hepatomegaly, no splenomegaly, no bruits Back: non tender, normal ROM, no scoliosis Musculoskeletal: upper extremities non tender, no obvious deformity, normal ROM throughout, lower extremities non tender, no obvious deformity, normal ROM throughout Extremities: no edema, no cyanosis, no clubbing Pulses: 2+ symmetric, upper and lower extremities, normal cap refill Neurological: alert, oriented x 3, CN2-12 intact, strength normal upper extremities and lower extremities, sensation normal throughout, DTRs 2+ throughout, no cerebellar signs, gait normal Psychiatric: normal affect, behavior normal, pleasant  Breast/gyn/rectal - deferred to gyn    Assessment and Plan :    Encounter Diagnoses  Name Primary?  . Encounter for health maintenance examination in adult Yes  . Chronic back pain   . Elevated prolactin level (HCC)   . Abnormal brain MRI   . Influenza vaccination declined     Physical exam - discussed healthy lifestyle, diet, exercise, preventative care, vaccinations, and addressed their concerns.  Handout given. See your eye doctor yearly for routine vision care. See your dentist yearly for routine dental care including hygiene visits twice yearly. reviewed recent 07/2015 brain MRI.  Pending labs, consider referral for further eval and management She declines flu shot despite having 44mo infant.   chronic back pain - currently her gynecologist has been filling her medication Follow-up pending labs

## 2015-08-11 ENCOUNTER — Telehealth: Payer: Self-pay

## 2015-08-11 DIAGNOSIS — E229 Hyperfunction of pituitary gland, unspecified: Principal | ICD-10-CM

## 2015-08-11 DIAGNOSIS — R9089 Other abnormal findings on diagnostic imaging of central nervous system: Secondary | ICD-10-CM

## 2015-08-11 DIAGNOSIS — R7989 Other specified abnormal findings of blood chemistry: Secondary | ICD-10-CM

## 2015-08-11 LAB — SEDIMENTATION RATE: Sed Rate: 4 mm/hr (ref 0–20)

## 2015-08-11 LAB — PROLACTIN: Prolactin: 33.5 ng/mL — ABNORMAL HIGH

## 2015-08-11 NOTE — Telephone Encounter (Signed)
-----   Message from Jac Canavan, PA-C sent at 08/11/2015  7:46 AM EST ----- Labs show borderline for diabetes, and elevated prolactin.  Rest of labs ok.   Exercise regularly, eat a healthy diet.  I'd like to refer to endocrinology for elevated prolactin, abnormal brain MRI

## 2015-08-11 NOTE — Telephone Encounter (Signed)
Referred to endocrinology

## 2015-09-17 ENCOUNTER — Telehealth: Payer: Self-pay | Admitting: Medical

## 2015-09-17 NOTE — Telephone Encounter (Signed)
Put in [facility] folder

## 2015-10-04 ENCOUNTER — Encounter: Payer: Self-pay | Admitting: Medical

## 2016-12-19 ENCOUNTER — Telehealth: Payer: Self-pay

## 2016-12-19 ENCOUNTER — Ambulatory Visit: Payer: BLUE CROSS/BLUE SHIELD | Admitting: Medical

## 2016-12-19 NOTE — Telephone Encounter (Signed)

## 2016-12-19 NOTE — Telephone Encounter (Signed)
Send no show letter

## 2016-12-22 ENCOUNTER — Encounter: Payer: Self-pay | Admitting: Medical

## 2016-12-22 NOTE — Telephone Encounter (Signed)
No show letter sent.

## 2017-09-08 IMAGING — MR MR HEAD WO/W CM
19 series · 44 of 48 positions shown · IV contrast (multihance)
Comparison: None.

CLINICAL DATA: Abnormal prolactin level.

EXAM:
MRI HEAD WITHOUT AND WITH CONTRAST
TECHNIQUE: Multiplanar, multiecho pulse sequences of the brain and surrounding
structures were obtained without and with intravenous contrast.
CONTRAST:  8mL MULTIHANCE GADOBENATE DIMEGLUMINE 529 MG/ML IV SOLN

[Series 6: T1 · sagittal · 4.0mm · 0.72mm/px · 2 of 29 slices shown (1 of 5)]
[im 1/29]
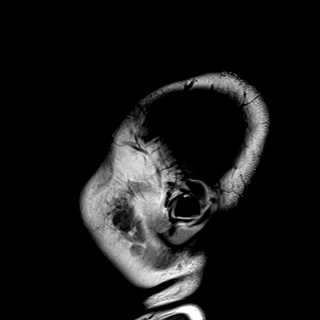
[im 29/29]
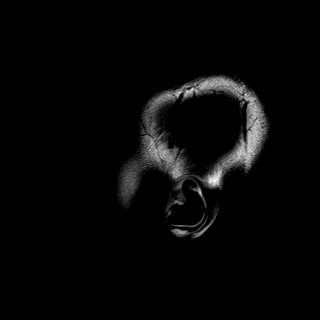

[Series 7: DWI · axial · 3.0mm · 1.44mm/px · z∈[-130,+1]mm · 5 of 72 slices shown (1 of 2)]
[im 1/72]
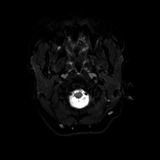
[im 18/72]
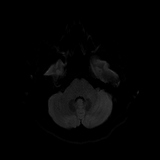
[im 36/72]
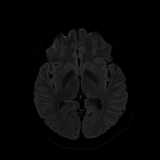
[im 54/72]
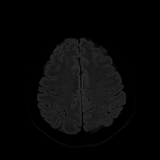
[im 72/72]
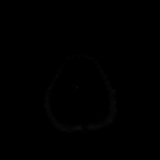

[Series 8: DWI · axial · 3.0mm · 1.44mm/px · z∈[-130,+1]mm · 3 of 36 slices shown (2 of 2)]
[im 1/36]
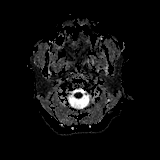
[im 18/36]
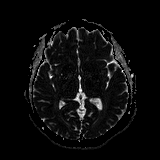
[im 36/36]
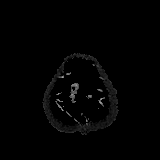

[Series 9: T2 · axial · 3.0mm · 0.36mm/px · z∈[-134,+5]mm · 3 of 38 slices shown]
[im 1/38]
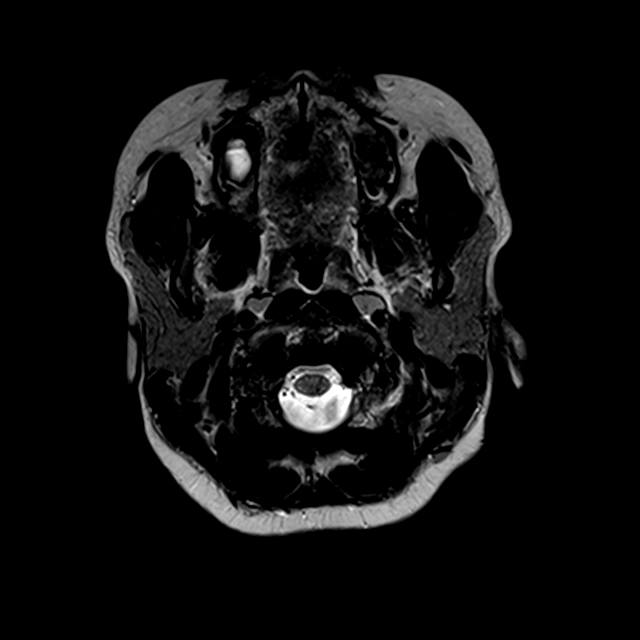
[im 19/38]
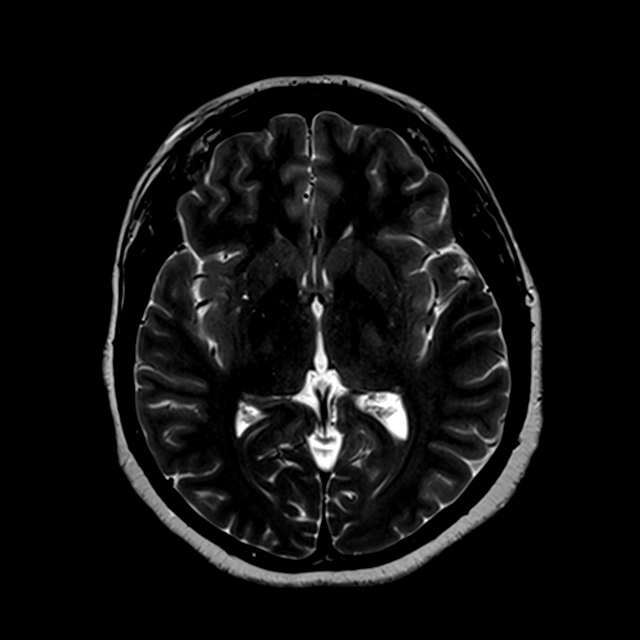
[im 38/38]
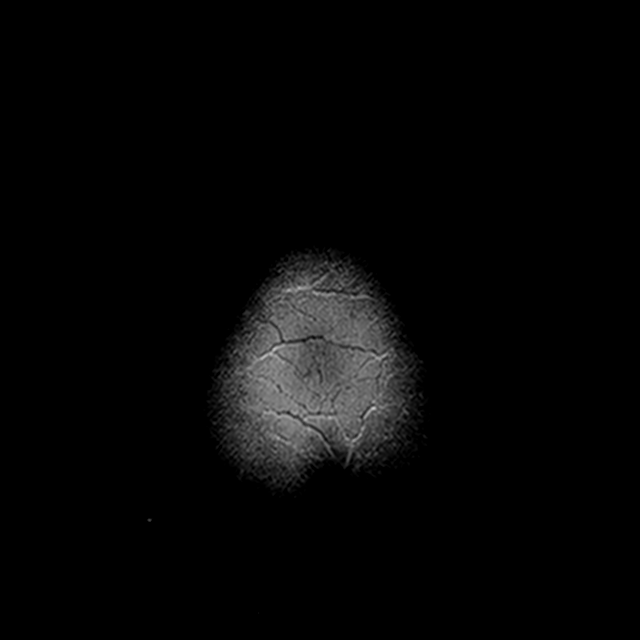

[Series 10: GRE · axial · 3.0mm · 0.69mm/px · z∈[-132,+6]mm · 3 of 38 slices shown]
[im 1/38]
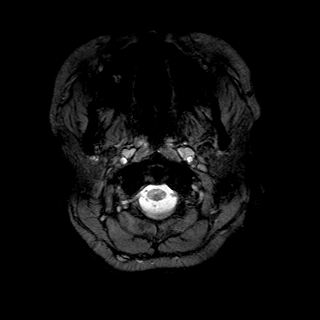
[im 19/38]
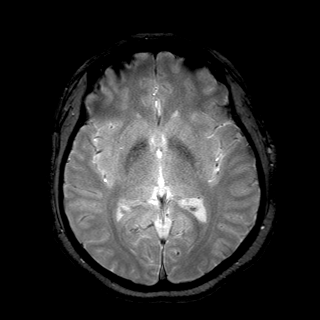
[im 38/38]
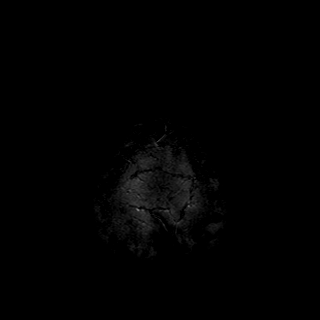

[Series 11: T1 · coronal · 3.0mm · 0.42mm/px · 1 of 10 slices shown (2 of 5)]
[im 1/10]
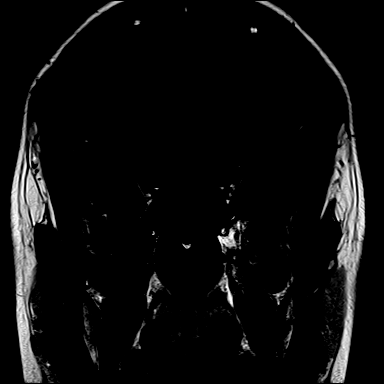

[Series 12: FLAIR · axial · 3.0mm · 0.72mm/px · z∈[-134,+5]mm · 3 of 38 slices shown]
[im 1/38]
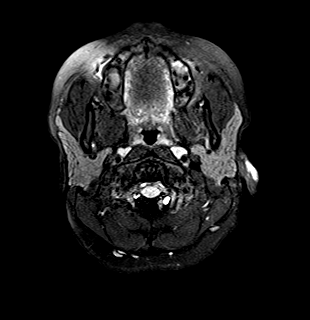
[im 19/38]
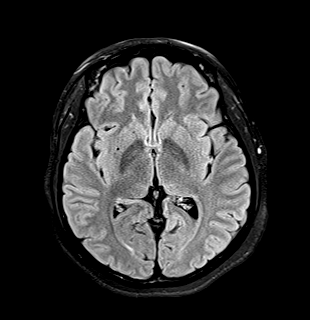
[im 38/38]
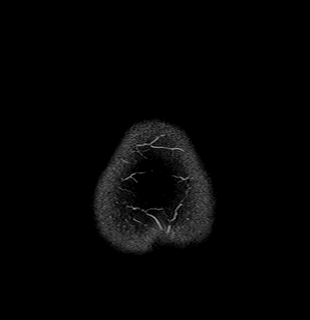

[Series 13: T1 · sagittal · 3.0mm · 0.42mm/px · 1 of 10 slices shown (3 of 5)]
[im 1/10]
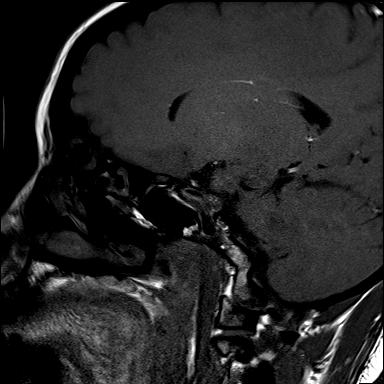

[Series 14: T1 · coronal · non-contrast · 3.0mm · 0.62mm/px · 1 of 7 slices shown (4 of 5)]
[im 1/7]
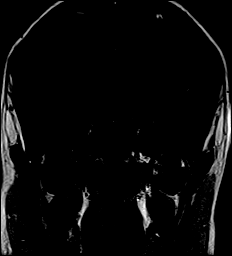

[Series 15: cor post dyn · coronal · 3.0mm · 0.62mm/px · 1 of 7 slices shown (1 of 6)]
[im 1/7]
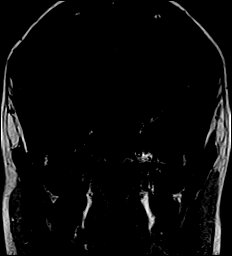

[Series 16: cor post dyn · coronal · 3.0mm · 0.62mm/px · 1 of 7 slices shown (2 of 6)]
[im 1/7]
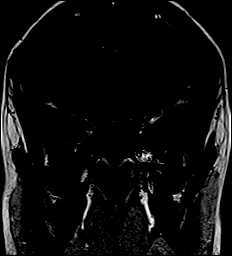

[Series 17: cor post dyn · coronal · 3.0mm · 0.62mm/px · 1 of 7 slices shown (3 of 6)]
[im 1/7]
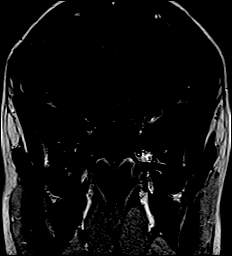

[Series 18: cor post dyn · coronal · 3.0mm · 0.62mm/px · 1 of 7 slices shown (4 of 6)]
[im 1/7]
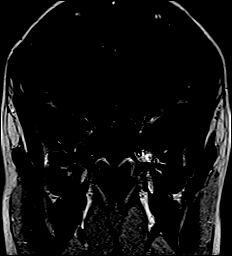

[Series 19: cor post dyn · coronal · 3.0mm · 0.62mm/px · 1 of 7 slices shown (5 of 6)]
[im 1/7]
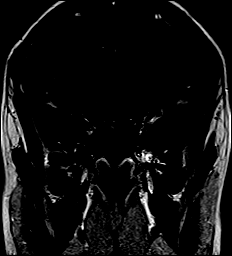

[Series 20: cor post dyn · coronal · 3.0mm · 0.62mm/px · 1 of 7 slices shown (6 of 6)]
[im 1/7]
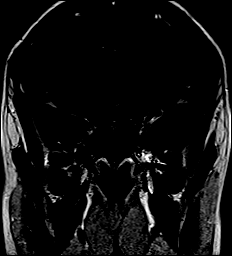

[Series 21: T1 post-contrast · sagittal · 3.0mm · 0.42mm/px · 1 of 10 slices shown (1 of 3)]
[im 1/10]
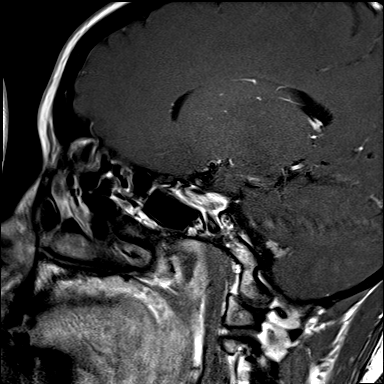

[Series 22: T1 post-contrast · coronal · 3.0mm · 0.42mm/px · 1 of 10 slices shown (2 of 3)]
[im 1/10]
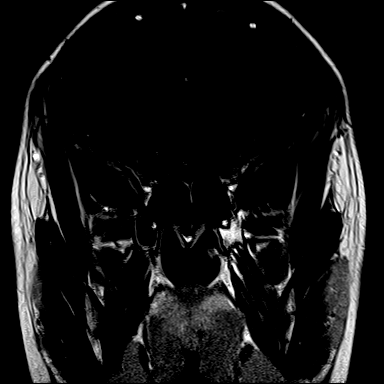

[Series 23: T1 · axial · 1.0mm · 0.86mm/px · z∈[-142,+26]mm · 11 of 175 slices shown (5 of 5)]
[im 1/175]
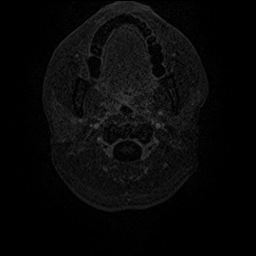
[im 13/175]
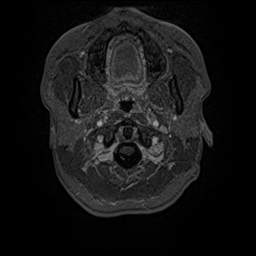
[im 25/175]
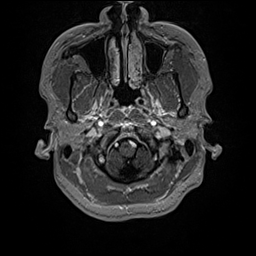
[im 38/175]
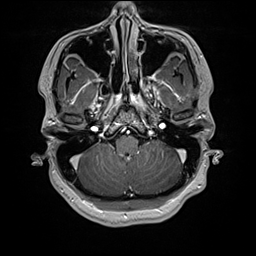
[im 50/175]
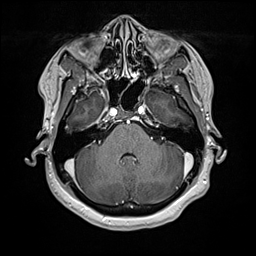
[im 63/175]
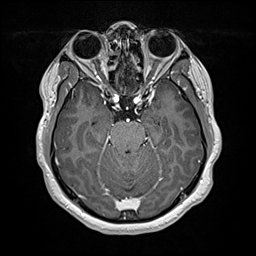
[im 75/175]
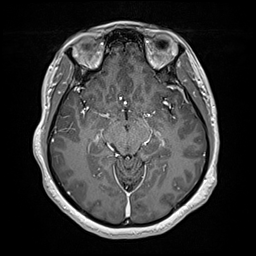
[im 100/175]
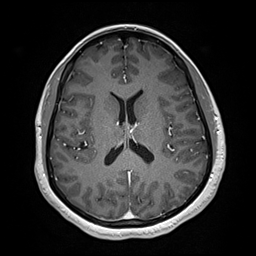
[im 125/175]
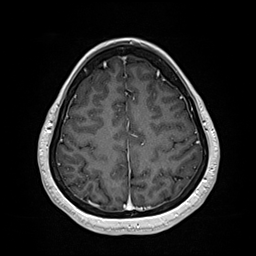
[im 150/175]
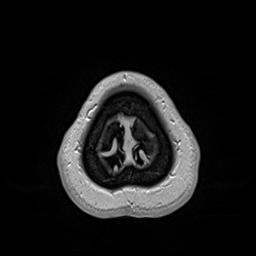
[im 175/175]
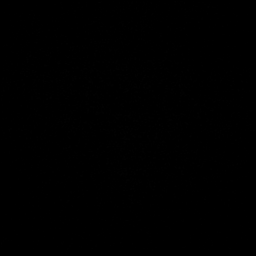

[Series 24: T1 post-contrast · coronal · 4.0mm · 0.47mm/px · 3 of 30 slices shown (3 of 3)]
[im 1/30]
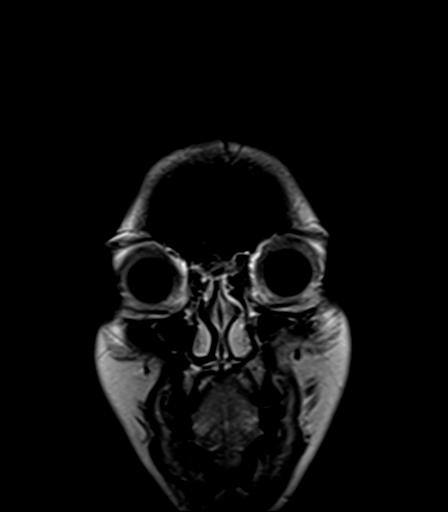
[im 15/30]
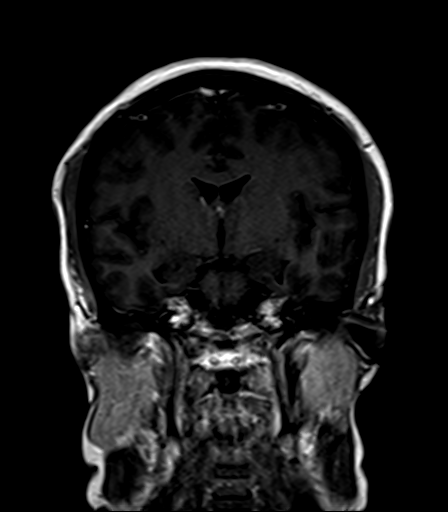
[im 30/30]
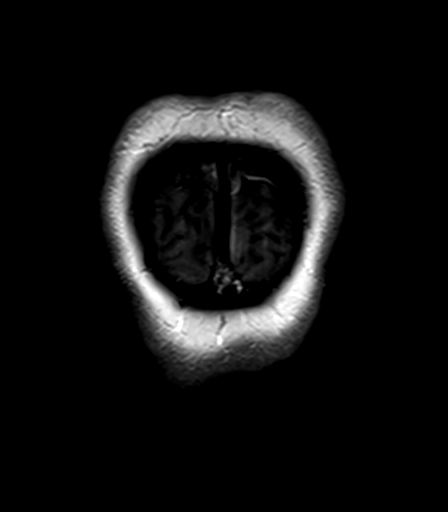

[44 of 48 positions shown; findings below may reference images not displayed]

FINDINGS: There is no evidence of acute infarct, intracranial hemorrhage,
mass, midline shift, or extra-axial fluid collection. Ventricles and
sulci are normal. There are 8-10 punctate foci of T2 hyperintensity
in the predominantly subcortical white matter of the frontal lobes.
No abnormal enhancement is identified.

Orbits are unremarkable. Mild paranasal sinus mucosal thickening is
greatest in the right ethmoid air cells. The mastoid air cells are
clear. Major intracranial vascular flow voids are preserved.

Dedicated imaging was performed through the sella turcica to
evaluate the pituitary gland. The pituitary gland is overall within
normal limits in size. However, the the right aspect of the gland is
subtly larger than the left with a mildly convex superior margin
compared to the less convex/flatter margin on the left. A 3 mm
hypoenhancing focus is questioned in the right aspect of the gland
on dynamic post-contrast images (series 19, image 5) with minimal
heterogeneity in this region on delayed images (series 22, image 6).
There is no significant infundibular deviation. The optic chiasm and
cavernous sinuses are unremarkable.
IMPRESSION: 1. Minimal asymmetry of the pituitary gland with questionable 3 mm
microadenoma on the right.
2. Minimal cerebral white matter T2 signal changes, nonspecific.
These may reflect minimal chronic small vessel ischemia, sequelae of
trauma, hypercoagulable state, vasculitis, migraines, prior
infection or less likely demyelination.

## 2019-09-25 ENCOUNTER — Other Ambulatory Visit (HOSPITAL_COMMUNITY): Admission: RE | Admit: 2019-09-25 | Payer: BLUE CROSS/BLUE SHIELD | Source: Ambulatory Visit

## 2019-09-25 ENCOUNTER — Ambulatory Visit (INDEPENDENT_AMBULATORY_CARE_PROVIDER_SITE_OTHER): Payer: BC Managed Care – PPO | Admitting: Medical

## 2019-09-25 ENCOUNTER — Encounter: Payer: Self-pay | Admitting: Medical

## 2019-09-25 ENCOUNTER — Other Ambulatory Visit: Payer: Self-pay

## 2019-09-25 VITALS — BP 110/68 | HR 85 | Temp 98.0°F | Ht 64.0 in | Wt 195.6 lb

## 2019-09-25 DIAGNOSIS — Z6833 Body mass index (BMI) 33.0-33.9, adult: Secondary | ICD-10-CM | POA: Diagnosis not present

## 2019-09-25 DIAGNOSIS — Z13220 Encounter for screening for lipoid disorders: Secondary | ICD-10-CM | POA: Diagnosis not present

## 2019-09-25 DIAGNOSIS — Z9889 Other specified postprocedural states: Secondary | ICD-10-CM | POA: Diagnosis not present

## 2019-09-25 DIAGNOSIS — E221 Hyperprolactinemia: Secondary | ICD-10-CM

## 2019-09-25 DIAGNOSIS — Z131 Encounter for screening for diabetes mellitus: Secondary | ICD-10-CM | POA: Diagnosis not present

## 2019-09-25 DIAGNOSIS — Z7185 Encounter for immunization safety counseling: Secondary | ICD-10-CM

## 2019-09-25 DIAGNOSIS — Z789 Other specified health status: Secondary | ICD-10-CM

## 2019-09-25 DIAGNOSIS — Z0000 Encounter for general adult medical examination without abnormal findings: Secondary | ICD-10-CM | POA: Diagnosis not present

## 2019-09-25 DIAGNOSIS — Z124 Encounter for screening for malignant neoplasm of cervix: Secondary | ICD-10-CM | POA: Insufficient documentation

## 2019-09-25 DIAGNOSIS — Z7189 Other specified counseling: Secondary | ICD-10-CM | POA: Insufficient documentation

## 2019-09-25 DIAGNOSIS — Z975 Presence of (intrauterine) contraceptive device: Secondary | ICD-10-CM

## 2019-09-25 LAB — LIPID PANEL

## 2019-09-25 NOTE — Patient Instructions (Signed)
Preventative Care for Adults - Female   Thank you for coming in for your well visit today, and thank you for trusting [place] with your care!   Maintain regular health and wellness exams:  A routine yearly physical is a good way to check in with your primary care provider about your health and preventive screening. It is also an opportunity to share updates about your health and any concerns you have, and receive a thorough all-over exam.   Most health insurance companies pay for at least some preventative services.  Check with your health plan for specific coverages.  What preventative services do women need?  Adult women should have their weight and blood pressure checked regularly.   Women age 23 and older should have their cholesterol levels checked regularly.  Women should be screened for cervical cancer with a Pap smear and pelvic exam beginning at either age 46, or 3 years after they become sexually activity.    Breast cancer screening generally begins at age 80 with a mammogram and breast exam by your primary care provider.    Beginning at age 63 and continuing to age 76, women should be screened for colorectal cancer.  Certain people may need continued testing until age 64.  Updating vaccinations is part of preventative care.  Vaccinations help protect against diseases such as the flu.  Osteoporosis is a disease in which the bones lose minerals and strength as we age. Women ages 53 and over should discuss this with their caregivers, as should women after menopause who have other risk factors.  Lab tests are generally done as part of preventative care to screen for anemia and blood disorders, to screen for problems with the kidneys and liver, to screen for bladder problems, to check blood sugar, and to check your cholesterol level.  Preventative services generally include counseling about diet, exercise, avoiding tobacco, drugs, excessive alcohol consumption, and sexually transmitted  infections.    Xrays and CT scans are not normally done as a preventative test, and most insurances do not pay for imaging for screening other than as discussed under cancer screens below.   On the other hand, if you have certain medical concerns, imaging may be necessary as a diagnostic test.   Your Medical Team Your medical team starts with [place], your PCP or primary care provider.  Please use our services for your routine care such as physicals, screenings, immunizations, sick visits, and your first stop for general medical concerns.  You can call our number for after hours information for urgent questions that may need attention but cannot wait til the next business day.    Urgent care-urgent cares exist to provide care when your primary care office would typically be closed such as evenings or weekends.   Urgent care is for evaluation of urgent medical problems that do not necessarily require emergency department care, but cannot wait til the next business day when we are open.  Emergency department care-please reserve emergency department care for serious, urgent, possibly life-threatening medical problems.  This includes issues like possible stroke, heart attack, significant injury, mental health crisis, or other urgent need that requires immediate medical attention.     See your dentist office twice yearly for hygiene and cleaning visits.   Brush your teeth and floss your teeth daily.  See your eye doctor yearly for routine eye exam and screenings for glaucoma and retinal disease.  See your gynecologist yearly if you do not have your female/gynecological exams at our  office.     Specific Concerns:    EYE DOCTOR RECOMMENDATIONS [facility] [address] [place], [place] [address] Phone: [phone] [email or web address]   [facility] Dr. Gelene Mink [address] [place] [place], [place] [address]   [phone] [email or web address]   Vincenza Hews, M.D. Susanne Greenhouse, O.D. [address] [place], [place] [address] Medical telephone: [phone] Optical telephone: [phone]   Dr. Glenford Peers [address] Felipa Emory [place], [place] [address] [phone]    DENTIST RECOMMENDATION Dr. Yancey Flemings, dentist [address] [place], [place] [address] [phone] [email or web address]       Vaccines:  Stay up to date with your tetanus shots and other required immunizations. You should have a booster for tetanus every 10 years. Be sure to get your flu shot every year, since 5%-20% of the [place] population comes down with the flu. The flu vaccine changes each year, so being vaccinated once is not enough. Get your shot in the fall, before the flu season peaks.   Other vaccines to consider:  Pneumococcal vaccine to protect against certain types of pneumonia.  This is normally recommended for adults age 70 or older.  However, adults younger than 34 years old with certain underlying conditions such as diabetes, heart or lung disease should also receive the vaccine.  Shingles vaccine to protect against Varicella Zoster if you are older than age 44, or younger than 34 years old with certain underlying illness.  If you have not had the Shingrix vaccine, please call your insurer to inquire about coverage for the Shingrix vaccine given in 2 doses.   Some insurers cover this vaccine after age 27, some cover this after age 41.  If your insurer covers this, then call to schedule appointment to have this vaccine here  Hepatitis A vaccine to protect against a form of infection of the liver by a virus acquired from food.  Hepatitis B vaccine to protect against a form of infection of the liver by a virus acquired from blood or body fluids, particularly if you work in health care.  If you plan to travel internationally, check with your local health department for specific  vaccination recommendations.  Human Papilloma Virus or HPV causes cancer of the cervix, and other infections that can be transmitted from person to person. There is a vaccine for HPV, and males should get immunized between the ages of 28 and 76. It requires a series of 3 shots.   Covid/Coronavirus - as the vaccines are becoming available, please consider vaccination if you are a health care worker, first responder, or have significant health problems such as asthma, COPD, heart disease, hypertension, diabetes, obesity, multiple medical problems, over age 36yo, or immunocompromised.    You are up to date on Tetanus vaccine  I recommend the following vaccines: Covid vaccine    What should I know about Cancer screening? Many types of cancers can be detected early and may often be prevented. Lung Cancer  You should be screened every year for lung cancer if: ? You are a current smoker who has smoked for at least 30 years. ? You are a former smoker who has quit within the past 15 years.  Talk to your health care provider about your screening options, when you should start screening, and how often you should be screened.  Breast cancer screening is essential to preventive care for women. All women age 52 and older should perform a breast  self-exam every month. At age 34 and older, women should have their caregiver complete a breast exam each year. Women at ages 51 and older should have a mammogram (x-ray film) of the breasts. Your caregiver can discuss how often you need mammograms.    Breast cancer screening   [facility]   Or    [facility]   [phone]         [phone]       [address] [place], [place] [address]        [place], [place] [address]  Cervical cancer screening includes taking a Pap smear (sample of cells examined under a microscope) from the cervix (end of the uterus). It also includes testing for  HPV (Human Papilloma Virus, which can cause cervical cancer). Screening and a pelvic exam should begin at age 70, or 3 years after a woman becomes sexually active. Screening should occur every year, with a Pap smear but no HPV testing, up to age 83. After age 82, you should have a Pap smear every 3 years with HPV testing, if no HPV was found previously.   Colorectal Cancer  Routine colorectal cancer screening usually begins at 34 years of age and should be repeated every 5-10 years until you are 34 years old. You may need to be screened more often if early forms of precancerous polyps or small growths are found. Your health care provider may recommend screening at an earlier age if you have risk factors for colon cancer.  Your health care provider may recommend using home test kits to check for hidden blood in the stool.  A small camera at the end of a tube can be used to examine your colon (sigmoidoscopy or colonoscopy). This checks for the earliest forms of colorectal cancer.  Skin Cancer  Check your skin from head to toe regularly.  Tell your health care provider about any new moles or changes in moles, especially if: ? There is a change in a mole's size, shape, or color. ? You have a mole that is larger than a pencil eraser.  Always use sunscreen. Apply sunscreen liberally and repeat throughout the day.  Protect yourself by wearing long sleeves, pants, a wide-brimmed hat, and sunglasses when outside.   Are you up to date on cancer screenings:  COLON CANCER SCREENING:  Age 4yo  BREAST CANCER SCREENING:  Monthly self breast exam  CERVICAL CANCER SCREENING:  Pap done today      GENERAL RECOMMENDATIONS FOR GOOD HEALTH:  Healthy diet:  Eat a variety of foods, including fruit, vegetables, animal or vegetable protein, such as meat, fish, chicken, and eggs, or beans, lentils, tofu, and grains, such as rice.  Drink plenty of water daily.  Decrease saturated fat in the diet, avoid  lots of red meat, processed foods, sweets, fast foods, and fried foods.  Exercise:  Aerobic exercise helps maintain good heart health. At least 30-40 minutes of moderate-intensity exercise is recommended. For example, a brisk walk that increases your heart rate and breathing. This should be done on most days of the week.   Find a type of exercise or a variety of exercises that you enjoy so that it becomes a part of your daily life.  Examples are running, walking, swimming, water aerobics, and biking.  For motivation and support, explore group exercise such as aerobic class, spin class, Zumba, Yoga,or  martial arts, etc.    Set exercise  goals for yourself, such as a certain weight goal, walk or run in a race such as a 5k walk/run.  Speak to your primary care provider about exercise goals.  Your weight readings per our records: Wt Readings from Last 3 Encounters:  09/25/19 195 lb 9.6 oz (88.7 kg)  08/10/15 176 lb (79.8 kg)  09/24/14 195 lb (88.5 kg)    Body mass index is 33.57 kg/m.    Disease prevention:  If you smoke or chew tobacco, find out from your caregiver how to quit. It can literally save your life, no matter how long you have been a tobacco user. If you do not use tobacco, never begin.   Maintain a healthy diet and normal weight. Increased weight leads to problems with blood pressure and diabetes.   The Body Mass Index or BMI is a way of measuring how much of your body is fat. Having a BMI above 27 increases the risk of heart disease, diabetes, hypertension, stroke and other problems related to obesity. Your caregiver can help determine your BMI and based on it develop an exercise and dietary program to help you achieve or maintain this important measurement at a healthful level.  High blood pressure causes heart and blood vessel problems.  Persistent high blood pressure should be treated with medicine if weight loss and exercise do not work.  Your blood pressure readings per  our records:     BP Readings from Last 3 Encounters:  09/25/19 110/68  08/10/15 100/68  09/26/14 (!) 109/57     Fat and cholesterol leaves deposits in your arteries that can block them. This causes heart disease and vessel disease elsewhere in your body.  If your cholesterol is found to be high, or if you have heart disease or certain other medical conditions, then you may need to have your cholesterol monitored frequently and be treated with medication.   Ask if you should have a cardiac stress test if your history suggests this. A stress test is a test done on a treadmill that looks for heart disease. This test can find disease prior to there being a problem.   Menopause can be associated with physical symptoms and risks. Hormone replacement therapy is available to decrease these. You should talk to your caregiver about whether starting or continuing to take hormones is right for you.   Osteoporosis is a disease in which the bones lose minerals and strength as we age. This can result in serious bone fractures. Risk of osteoporosis can be identified using a bone density scan. Women ages 60 and over should discuss this with their caregivers, as should women after menopause who have other risk factors. Ask your caregiver whether you should be taking a calcium supplement and Vitamin D, to reduce the rate of osteoporosis.   Osteoporosis screening/bone density testing:  [facility]   Or    [facility]   [phone]          [phone] [address]       [address] [place], [place] [address]        [place], [place] [address]    Avoid drinking alcohol in excess (more than two drinks per day).  Avoid use of street drugs. Do not share needles with anyone. Ask for professional help if you need assistance or instructions on stopping the use of alcohol, cigarettes, and/or drugs.  Brush your teeth twice a day with fluoride toothpaste,  and  floss once a day. Good oral hygiene prevents tooth decay and gum disease. The problems can be painful, unattractive, and can cause other health problems. Visit your dentist for a routine oral and dental check up and preventive care every 6-12 months.   Safety:  Use seatbelts 100% of the time, whether driving or as a passenger.  Use safety devices such as hearing protection if you work in environments with loud noise or significant background noise.  Use safety glasses when doing any work that could send debris in to the eyes.  Use a helmet if you ride a bike or motorcycle.  Use appropriate safety gear for contact sports.  Talk to your caregiver about gun safety.  Use sunscreen with a SPF (or skin protection factor) of 15 or greater.  Lighter skinned people are at a greater risk of skin cancer. Don't forget to also wear sunglasses in order to protect your eyes from too much damaging sunlight. Damaging sunlight can accelerate cataract formation.   Keep carbon monoxide and smoke detectors in your home functioning at all times. Change the batteries every 6 months or use a model that plugs into the wall.    Sexual activity: . Sex is a normal part of life and sexual activity can continue into older adulthood for many healthy people.   . If you are having issues related to sexual activity, please follow up to discuss this further.   . If you are not in a monogamous relationship or have more than one partner, please practice safe sex.  Use condoms. Condoms are used for birth control and to help reduce the spread of sexually transmitted infections (or STIs).  Some of the STIs are gonorrhea (the clap), chlamydia, syphilis, trichomonas, herpes, HPV (human papilloma virus) and HIV (human immunodeficiency virus) which causes AIDS. The herpes, HIV and HPV are viral illnesses that have no cure. These can result in disability, cancer and death.   We are able to test for STIs here at our office.

## 2019-09-25 NOTE — Progress Notes (Signed)
Subjective:   HPI  Patricia Allen is a 34 y.o. female who presents for Chief Complaint  Patient presents with  . Annual Exam    with fasting labs     Patient Care Team: Reshard Guillet, Kermit Balo, PA-C as PCP - General (Family Medicine) Gyn - saw Dr. Dois Davenport Rivard back in 2018   Concerns: Gets random pains in back, arms, chest, no rhyme or reason, no swelling, no paresthesias.  Stretches but not a lot of exercise.  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    Has IUD x 3 years  Past Medical History:  Diagnosis Date  . Abnormal Pap smear    Resolved as of last pap.  Colpo 1/11    Past Surgical History:  Procedure Laterality Date  . COLPOSCOPY      Social History   Socioeconomic History  . Marital status: Married    Spouse name: Not on file  . Number of children: Not on file  . Years of education: Not on file  . Highest education level: Not on file  Occupational History  . Not on file  Tobacco Use  . Smoking status: Never Smoker  . Smokeless tobacco: Never Used  Substance and Sexual Activity  . Alcohol use: No  . Drug use: No  . Sexual activity: Yes    Comment: Pregnant  Other Topics Concern  . Not on file  Social History Narrative   Lives with husband and 3 children.   Hair stylist.   Exercise - walk.  09/2019   Social Determinants of Health   Financial Resource Strain:   . Difficulty of Paying Living Expenses:   Food Insecurity:   . Worried About Programme researcher, broadcasting/film/video in the Last Year:   . Barista in the Last Year:   Transportation Needs:   . Freight forwarder (Medical):   Marland Kitchen Lack of Transportation (Non-Medical):   Physical Activity:   . Days of Exercise per Week:   . Minutes of Exercise per Session:   Stress:   . Feeling of Stress :   Social Connections:   . Frequency of Communication with Friends and Family:   . Frequency of Social Gatherings with Friends and Family:   . Attends Religious Services:   .  Active Member of Clubs or Organizations:   . Attends Banker Meetings:   Marland Kitchen Marital Status:   Intimate Partner Violence:   . Fear of Current or Ex-Partner:   . Emotionally Abused:   Marland Kitchen Physically Abused:   . Sexually Abused:     Family History  Problem Relation Age of Onset  . Hypertension Maternal Grandmother   . Hypertension Paternal Grandmother   . Cancer Neg Hx   . Heart disease Neg Hx   . Diabetes Neg Hx   . Stroke Neg Hx      Current Outpatient Medications:  .  levonorgestrel (MIRENA, 52 MG,) 20 MCG/24HR IUD, Mirena 20 mcg/24 hr (5 years) intrauterine device  Take by intrauterine route., Disp: , Rfl:  .  ferrous fumarate (HEMOCYTE - 106 MG FE) 325 (106 FE) MG TABS tablet, Take 1 tablet by mouth., Disp: , Rfl:  .  ibuprofen (ADVIL,MOTRIN) 600 MG tablet, Take 1 tablet (600 mg total) by mouth every 6 (six) hours as needed. (Patient not taking: Reported on 09/25/2019), Disp: 30 tablet, Rfl: 2 .  oxyCODONE-acetaminophen (PERCOCET/ROXICET) 5-325 MG per tablet, Take 1  tablet by mouth every 4 (four) hours as needed (for pain scale 4-7). (Patient not taking: Reported on 09/25/2019), Disp: 30 tablet, Rfl: 0 .  prenatal vitamin w/FE, FA (NATACHEW) 29-1 MG CHEW chewable tablet, Chew 1 tablet by mouth daily at 12 noon., Disp: , Rfl:   Allergies  Allergen Reactions  . Apple Itching  . Other     Herbicides     Reviewed their medical, surgical, family, social, medication, and allergy history and updated chart as appropriate.   Review of Systems Constitutional: -fever, -chills, -sweats, -unexpected weight change, -decreased appetite, -fatigue Allergy: -sneezing, -itching, -congestion Dermatology: -changing moles, --rash, -lumps ENT: -runny nose, -ear pain, -sore throat, -hoarseness, -sinus pain, -teeth pain, - ringing in ears, -hearing loss, -nosebleeds Cardiology: -chest pain, -palpitations, -swelling, -difficulty breathing when lying flat, -waking up short of  breath Respiratory: -cough, -shortness of breath, -difficulty breathing with exercise or exertion, -wheezing, -coughing up blood Gastroenterology: -abdominal pain, -nausea, -vomiting, -diarrhea, -constipation, -blood in stool, -changes in bowel movement, -difficulty swallowing or eating Hematology: -bleeding, -bruising  Musculoskeletal: -joint aches, +muscle aches, -joint swelling, -back pain, -neck pain, -cramping, -changes in gait Ophthalmology: denies vision changes, eye redness, itching, discharge Urology: -burning with urination, -difficulty urinating, -blood in urine, -urinary frequency, -urgency, -incontinence Neurology: -headache, -weakness, -tingling, -numbness, -memory loss, -falls, -dizziness Psychology: -depressed mood, -agitation, -sleep problems Breast/gyn: -breast tendnerss, -discharge, -lumps, -vaginal discharge,- irregular periods, -heavy periods     Objective:  BP 110/68   Pulse 85   Temp 98 F (36.7 C)   Ht 5\' 4"  (1.626 m)   Wt 195 lb 9.6 oz (88.7 kg)   SpO2 97%   BMI 33.57 kg/m   General appearance: alert, no distress, WD/WN, African American female Skin: dark brown flat 72mm squarish birth mark left volar medial palm, no worrisome lesions Neck: supple, no lymphadenopathy, no thyromegaly, no masses, normal ROM, no bruits Chest: non tender, normal shape and expansion Heart: RRR, normal S1, S2, no murmurs Lungs: CTA bilaterally, no wheezes, rhonchi, or rales Abdomen: +bs, soft, non tender, non distended, no masses, no hepatomegaly, no splenomegaly, no bruits Back: non tender, normal ROM, no scoliosis Musculoskeletal: upper extremities non tender, no obvious deformity, normal ROM throughout, lower extremities non tender, no obvious deformity, normal ROM throughout Extremities: no edema, no cyanosis, no clubbing Pulses: 2+ symmetric, upper and lower extremities, normal cap refill Neurological: alert, oriented x 3, CN2-12 intact, strength normal upper extremities and  lower extremities, sensation normal throughout, DTRs 2+ throughout, no cerebellar signs, gait normal Psychiatric: normal affect, behavior normal, pleasant  Breast: nontender, no masses or lumps, no skin changes, no nipple discharge or inversion, no axillary lymphadenopathy Gyn: Normal external genitalia without lesions, vagina with normal mucosa, cervix without lesions, no cervical motion tenderness, no abnormal vaginal discharge.  Uterus and adnexa not enlarged, nontender, no masses.  IUD in place, strings a little long but not a concern per patient, Pap performed.  Exam chaperoned by nurse. Rectal: anus normal appearing    Assessment and Plan :   Encounter Diagnoses  Name Primary?  . Encounter for health maintenance examination in adult Yes  . History of colposcopy with cervical biopsy - 06/2009   . Language barrier   . BMI 33.0-33.9,adult   . Screening for diabetes mellitus   . Screening for lipid disorders   . Vaccine counseling   . Hyperprolactinemia ([place])   . Screening for cervical cancer   . IUD (intrauterine device) in place     Physical  exam - discussed and counseled on healthy lifestyle, diet, exercise, preventative care, vaccinations, sick and well care, proper use of emergency dept and after hours care, and addressed their concerns.    Health screening: Advised they see their eye doctor yearly for routine vision care. Advised they see their dentist yearly for routine dental care including hygiene visits twice yearly.  Cancer screening Counseled on self breast exams, mammograms, cervical cancer screening   Vaccinations: Advised yearly influenza vaccine Declines flu shot Up to date on Td She will consider covid vaccine  Separate significant chronic issues discussed: BMI - advised weight loss through health diet and exercise  Hx/o elevated prolactin, prior finding on MRI   Cashae was seen today for annual exam.  Diagnoses and all orders for this  visit:  Encounter for health maintenance examination in adult -     VITAMIN D 25 Hydroxy (Vit-D Deficiency, Fractures) -     Comprehensive metabolic panel -     CBC with Differential/Platelet -     TSH -     Lipid panel -     Hemoglobin A1c -     Prolactin -     Cytology - PAP([place])  History of colposcopy with cervical biopsy - 06/2009  Language barrier  BMI 33.0-33.9,adult -     TSH -     Hemoglobin A1c  Screening for diabetes mellitus -     Hemoglobin A1c  Screening for lipid disorders -     Lipid panel  Vaccine counseling  Hyperprolactinemia ([facility]) -     Prolactin  Screening for cervical cancer -     Cytology - PAP([place])  IUD (intrauterine device) in place    Follow-up pending labs, yearly for physical

## 2019-09-26 LAB — COMPREHENSIVE METABOLIC PANEL
ALT: 8 IU/L (ref 0–32)
AST: 14 IU/L (ref 0–40)
Albumin/Globulin Ratio: 1.6 (ref 1.2–2.2)
Albumin: 4.2 g/dL (ref 3.8–4.8)
Alkaline Phosphatase: 67 IU/L (ref 39–117)
BUN/Creatinine Ratio: 15 (ref 9–23)
BUN: 12 mg/dL (ref 6–20)
Bilirubin Total: 0.2 mg/dL (ref 0.0–1.2)
CO2: 25 mmol/L (ref 20–29)
Calcium: 9.8 mg/dL (ref 8.7–10.2)
Chloride: 100 mmol/L (ref 96–106)
Creatinine, Ser: 0.79 mg/dL (ref 0.57–1.00)
GFR calc Af Amer: 114 mL/min/{1.73_m2} (ref >59)
GFR calc non Af Amer: 99 mL/min/{1.73_m2} (ref >59)
Globulin, Total: 2.7 g/dL (ref 1.5–4.5)
Glucose: 93 mg/dL (ref 65–99)
Potassium: 4.3 mmol/L (ref 3.5–5.2)
Sodium: 136 mmol/L (ref 134–144)
Total Protein: 6.9 g/dL (ref 6.0–8.5)

## 2019-09-26 LAB — CBC WITH DIFFERENTIAL/PLATELET
Basophils Absolute: 0.1 10*3/uL (ref 0.0–0.2)
Basos: 1 %
EOS (ABSOLUTE): 0.4 10*3/uL (ref 0.0–0.4)
Eos: 5 %
Hematocrit: 40.1 % (ref 34.0–46.6)
Hemoglobin: 12.6 g/dL (ref 11.1–15.9)
Immature Grans (Abs): 0 10*3/uL (ref 0.0–0.1)
Immature Granulocytes: 0 %
Lymphocytes Absolute: 2.4 10*3/uL (ref 0.7–3.1)
Lymphs: 32 %
MCH: 27.3 pg (ref 26.6–33.0)
MCHC: 31.4 g/dL — ABNORMAL LOW (ref 31.5–35.7)
MCV: 87 fL (ref 79–97)
Monocytes Absolute: 0.8 10*3/uL (ref 0.1–0.9)
Monocytes: 11 %
Neutrophils Absolute: 3.9 10*3/uL (ref 1.4–7.0)
Neutrophils: 51 %
Platelets: 294 10*3/uL (ref 150–450)
RBC: 4.62 x10E6/uL (ref 3.77–5.28)
RDW: 13.1 % (ref 11.7–15.4)
WBC: 7.5 10*3/uL (ref 3.4–10.8)

## 2019-09-26 LAB — LIPID PANEL
Chol/HDL Ratio: 1.9 ratio (ref 0.0–4.4)
Cholesterol, Total: 137 mg/dL (ref 100–199)
HDL: 72 mg/dL (ref >39)
LDL Chol Calc (NIH): 55 mg/dL (ref 0–99)
Triglycerides: 40 mg/dL (ref 0–149)
VLDL Cholesterol Cal: 10 mg/dL (ref 5–40)

## 2019-09-26 LAB — HEMOGLOBIN A1C
Est. average glucose Bld gHb Est-mCnc: 120 mg/dL
Hgb A1c MFr Bld: 5.8 % — ABNORMAL HIGH (ref 4.8–5.6)

## 2019-09-26 LAB — PROLACTIN: Prolactin: 25.6 ng/mL — ABNORMAL HIGH (ref 4.8–23.3)

## 2019-09-26 LAB — CYTOLOGY - PAP
Adequacy: ABSENT
Comment: NEGATIVE
Diagnosis: NEGATIVE
High risk HPV: NEGATIVE

## 2019-09-26 LAB — VITAMIN D 25 HYDROXY (VIT D DEFICIENCY, FRACTURES): Vit D, 25-Hydroxy: 56.5 ng/mL (ref 30.0–100.0)

## 2019-09-26 LAB — TSH: TSH: 0.982 u[IU]/mL (ref 0.450–4.500)

## 2020-09-27 ENCOUNTER — Encounter: Payer: BC Managed Care – PPO | Admitting: Medical

## 2020-10-06 ENCOUNTER — Telehealth: Payer: Self-pay | Admitting: Medical

## 2020-10-06 ENCOUNTER — Encounter: Payer: BC Managed Care – PPO | Admitting: Medical

## 2020-10-06 NOTE — Telephone Encounter (Signed)

## 2020-10-12 ENCOUNTER — Encounter: Payer: Self-pay | Admitting: Medical

## 2021-02-03 ENCOUNTER — Other Ambulatory Visit: Payer: Self-pay

## 2021-02-03 ENCOUNTER — Encounter: Payer: Self-pay | Admitting: Medical

## 2021-02-03 ENCOUNTER — Ambulatory Visit (INDEPENDENT_AMBULATORY_CARE_PROVIDER_SITE_OTHER): Payer: BC Managed Care – PPO | Admitting: Medical

## 2021-02-03 VITALS — BP 110/60 | HR 66 | Ht 64.0 in | Wt 195.6 lb

## 2021-02-03 DIAGNOSIS — R0789 Other chest pain: Secondary | ICD-10-CM

## 2021-02-03 DIAGNOSIS — Z7185 Encounter for immunization safety counseling: Secondary | ICD-10-CM

## 2021-02-03 DIAGNOSIS — R7301 Impaired fasting glucose: Secondary | ICD-10-CM

## 2021-02-03 DIAGNOSIS — Z0000 Encounter for general adult medical examination without abnormal findings: Secondary | ICD-10-CM

## 2021-02-03 DIAGNOSIS — E221 Hyperprolactinemia: Secondary | ICD-10-CM | POA: Diagnosis not present

## 2021-02-03 DIAGNOSIS — Z9889 Other specified postprocedural states: Secondary | ICD-10-CM | POA: Diagnosis not present

## 2021-02-03 DIAGNOSIS — Z6833 Body mass index (BMI) 33.0-33.9, adult: Secondary | ICD-10-CM

## 2021-02-03 DIAGNOSIS — Z975 Presence of (intrauterine) contraceptive device: Secondary | ICD-10-CM

## 2021-02-03 DIAGNOSIS — R1319 Other dysphagia: Secondary | ICD-10-CM

## 2021-02-03 DIAGNOSIS — H579 Unspecified disorder of eye and adnexa: Secondary | ICD-10-CM | POA: Insufficient documentation

## 2021-02-03 MED ORDER — OMEPRAZOLE 40 MG PO CPDR: 40.0000 mg | DELAYED_RELEASE_CAPSULE | Freq: Every day | ORAL | 0 refills | Status: DC

## 2021-02-03 NOTE — Progress Notes (Signed)
Subjective:   HPI  Patricia Allen is a 35 y.o. female who presents for Chief Complaint  Patient presents with   cpe    Fasting cpe., having hard time swallowing- spits a lot, shots in heart     Patient Care Team: Patricia Allen, Patricia Baloavid S, PA-C as PCP - General (Family Medicine) Gyn - saw Patricia Allen back in 2018  Concerns: Having more spit and saliva than usual.   Has trouble switch swallowing, saying too much spit inside.   Not a lot of belching.  No NVD, no constipation.  She does eat a lot of spicy foods and acidic foods such as tomatoes and citrus  She notes crampy sensation in chest.  Sometimes maybe twice daily, intermittent, not daily.  Maybe 2 times in past month.  No associated sweats, SOB, and not worse with activity or exercise.  Left eye feels pressure.  No eye doctor.   No injury no trauma.  No blurred vision.  No vision loss.  This is intermittent.  No redness or drainage.  Has IUD in place.  Sees gyn, up to date on pap  Past Medical History:  Diagnosis Date   Abnormal Pap smear    Resolved as of last pap.  Colpo 1/11   IUD (intrauterine device) in place     Past Surgical History:  Procedure Laterality Date   COLPOSCOPY      Social History   Socioeconomic History   Marital status: Married    Spouse name: Not on file   Number of children: Not on file   Years of education: Not on file   Highest education level: Not on file  Occupational History   Not on file  Tobacco Use   Smoking status: Never   Smokeless tobacco: Never  Vaping Use   Vaping Use: Never used  Substance and Sexual Activity   Alcohol use: No   Drug use: No   Sexual activity: Yes    Comment: Pregnant  Other Topics Concern   Not on file  Social History Narrative   Lives with husband and 3 children.   Hair stylist.   Exercise - walk.  01/2021   Social Determinants of Health   Financial Resource Strain: Not on file  Food Insecurity: Not on file  Transportation Needs: Not on file   Physical Activity: Not on file  Stress: Not on file  Social Connections: Not on file  Intimate Partner Violence: Not on file    Family History  Problem Relation Age of Onset   Hypertension Maternal Grandmother    Hypertension Paternal Grandmother    Cancer Neg Hx    Heart disease Neg Hx    Diabetes Neg Hx    Stroke Neg Hx      Current Outpatient Medications:    ferrous fumarate (HEMOCYTE - 106 MG FE) 325 (106 FE) MG TABS tablet, Take 1 tablet by mouth., Disp: , Rfl:    ibuprofen (ADVIL,MOTRIN) 600 MG tablet, Take 1 tablet (600 mg total) by mouth every 6 (six) hours as needed., Disp: 30 tablet, Rfl: 2   levonorgestrel (MIRENA, 52 MG,) 20 MCG/24HR IUD, Mirena 20 mcg/24 hr (5 years) intrauterine device  Take by intrauterine route., Disp: , Rfl:    omeprazole (PRILOSEC) 40 MG capsule, Take 1 capsule (40 mg total) by mouth daily., Disp: 30 capsule, Rfl: 0  Allergies  Allergen Reactions   Apple Itching   Other     Herbicides     Reviewed their  medical, surgical, family, social, medication, and allergy history and updated chart as appropriate.   Review of Systems Constitutional: -fever, -chills, -sweats, -unexpected weight change, -decreased appetite, -fatigue Allergy: -sneezing, -itching, -congestion Dermatology: -changing moles, --rash, -lumps ENT: -runny nose, -ear pain, -sore throat, -hoarseness, -sinus pain, -teeth pain, - ringing in ears, -hearing loss, -nosebleeds Cardiology: ?chest pain, -palpitations, -swelling, -difficulty breathing when lying flat, -waking up short of breath Respiratory: -cough, -shortness of breath, -difficulty breathing with exercise or exertion, -wheezing, -coughing up blood Gastroenterology: -abdominal pain, -nausea, -vomiting, -diarrhea, -constipation, -blood in stool, -changes in bowel movement, -difficulty swallowing or eating Hematology: -bleeding, -bruising  Musculoskeletal: -joint aches, -muscle aches, -joint swelling, -back pain, -neck  pain, -cramping, -changes in gait Ophthalmology: denies vision changes, eye redness, itching, discharge Urology: -burning with urination, -difficulty urinating, -blood in urine, -urinary frequency, -urgency, -incontinence Neurology: -headache, -weakness, -tingling, -numbness, -memory loss, -falls, -dizziness Psychology: -depressed mood, -agitation, -sleep problems Breast/gyn: -breast tendnerss, -discharge, -lumps, -vaginal discharge,- irregular periods, -heavy periods      Objective:  BP 110/60   Pulse 66   Ht 5\' 4"  (1.626 m)   Wt 195 lb 9.6 oz (88.7 kg)   BMI 33.57 kg/m   General appearance: alert, no distress, WD/WN, African American female Skin: dark brown flat 7mm squarish birth Allen left volar medial palm, no worrisome lesions Neck: supple, no lymphadenopathy, no thyromegaly, no masses, normal ROM, no bruits Chest: non tender, normal shape and expansion Heart: RRR, normal S1, S2, no murmurs Lungs: CTA bilaterally, no wheezes, rhonchi, or rales Abdomen: +bs, soft, non tender, non distended, no masses, no hepatomegaly, no splenomegaly, no bruits Back: non tender, normal ROM, no scoliosis Musculoskeletal: upper extremities non tender, no obvious deformity, normal ROM throughout, lower extremities non tender, no obvious deformity, normal ROM throughout Extremities: no edema, no cyanosis, no clubbing Pulses: 2+ symmetric, upper and lower extremities, normal cap refill Neurological: alert, oriented x 3, CN2-12 intact, strength normal upper extremities and lower extremities, sensation normal throughout, DTRs 2+ throughout, no cerebellar signs, gait normal Psychiatric: normal affect, behavior normal, pleasant  Breast/gyn - deferred   EKG Indication chest pressure and physical exam. Rate 68 bpm, normal sinus rhythm, PR 188 ms, QRS 76 ms, QTC 401 ms, axis 65 degrees, no worrisome acute changes   Assessment and Plan :   This visit was a preventative care visit, also known as  wellness visit or routine physical.   Topics typically include healthy lifestyle, diet, exercise, preventative care, vaccinations, sick and well care, proper use of emergency dept and after hours care, as well as other concerns.     Recommendations: Continue to return yearly for your annual wellness and preventative care visits.  This gives 1m a chance to discuss healthy lifestyle, exercise, vaccinations, review your chart record, and perform screenings where appropriate.  I recommend you see your eye doctor yearly for routine vision care.  I recommend you see your dentist yearly for routine dental care including hygiene visits twice yearly.  See your gynecologist yearly for routine gynecological care.   Vaccination recommendations were reviewed Immunization History  Administered Date(Allen) Administered   Tdap 09/25/2014   I recommend a yearly flu shot in the fall    Screening for cancer: Colon cancer screening: Age 27  Breast cancer screening: You should perform a self breast exam monthly.   We reviewed recommendations for regular mammograms and breast cancer screening.  Cervical cancer screening: We reviewed recommendations for pap smear screening.   Skin cancer screening:  Check your skin regularly for new changes, growing lesions, or other lesions of concern Come in for evaluation if you have skin lesions of concern.  Lung cancer screening: If you have a greater than 20 pack year history of tobacco use, then you may qualify for lung cancer screening with a chest CT scan.   Please call your insurance company to inquire about coverage for this test.  We currently don't have screenings for other cancers besides breast, cervical, colon, and lung cancers.  If you have a strong family history of cancer or have other cancer screening concerns, please let me know.    Bone health: Get at least 150 minutes of aerobic exercise weekly Get weight bearing exercise at least once  weekly Bone density test:  A bone density test is an imaging test that uses a type of X-ray to measure the amount of calcium and other minerals in your bones. The test may be used to diagnose or screen you for a condition that causes weak or thin bones (osteoporosis), predict your risk for a broken bone (fracture), or determine how well your osteoporosis treatment is working. The bone density test is recommended for females 65 and older, or females or males <65 if certain risk factors such as thyroid disease, long term use of steroids such as for asthma or rheumatological issues, vitamin D deficiency, estrogen deficiency, family history of osteoporosis, self or family history of fragility fracture in first degree relative.    Heart health: Get at least 150 minutes of aerobic exercise weekly Limit alcohol It is important to maintain a healthy blood pressure and healthy cholesterol numbers  Heart disease screening: Screening for heart disease includes screening for blood pressure, fasting lipids, glucose/diabetes screening, BMI height to weight ratio, reviewed of smoking status, physical activity, and diet.    Goals include blood pressure 120/80 or less, maintaining a healthy lipid/cholesterol profile, preventing diabetes or keeping diabetes numbers under good control, not smoking or using tobacco products, exercising most days per week or at least 150 minutes per week of exercise, and eating healthy variety of fruits and vegetables, healthy oils, and avoiding unhealthy food choices like fried food, fast food, high sugar and high cholesterol foods.    Other tests may possibly include EKG test, CT coronary calcium score, echocardiogram, exercise treadmill stress test.     Medical care options: I recommend you continue to seek care here first for routine care.  We try really hard to have available appointments Monday through Friday daytime hours for sick visits, acute visits, and physicals.   Urgent care should be used for after hours and weekends for significant issues that cannot wait till the next day.  The emergency department should be used for significant potentially life-threatening emergencies.  The emergency department is expensive, can often have long wait times for less significant concerns, so try to utilize primary care, urgent care, or telemedicine when possible to avoid unnecessary trips to the emergency department.  Virtual visits and telemedicine have been introduced since the pandemic started in 2020, and can be convenient ways to receive medical care.  We offer virtual appointments as well to assist you in a variety of options to seek medical care.    Specific concerns: Chest pressure -  We will check routine labs EKG heart tracing reviewed This could be related to acid reflux and the trouble you are having with saliva and swallowing   Trouble swallowing, excess saliva  Labs today We may end up needing  to refer you to a specialist Begin trial of omeprazole 40 mg daily.  1 tablet in the morning 45 to 60 minutes before breakfast. For now I would cut back on acidic foods, spicy foods, citrus and other things that can increase acid reflux   BMI >30/obesity  I recommend you work losing weight through healthy diet and exercise Exercise at least 5 days/week such as walking, jogging, aerobics or other   Neha was seen today for cpe.  Diagnoses and all orders for this visit:  Vaccine counseling  Encounter for health maintenance examination in adult -     EKG 12-Lead -     Comprehensive metabolic panel -     CBC -     TSH + free T4 -     Prolactin -     Hemoglobin A1c  IUD (intrauterine device) in place  Hyperprolactinemia (HCC) -     TSH + free T4 -     Prolactin  History of colposcopy with cervical biopsy - 06/2009  BMI 33.0-33.9,adult  Other dysphagia  Chest pressure -     EKG 12-Lead  Eye pressure -     Ambulatory referral to  Ophthalmology  Impaired fasting blood sugar -     Hemoglobin A1c  Other orders -     omeprazole (PRILOSEC) 40 MG capsule; Take 1 capsule (40 mg total) by mouth daily.   Follow up pending labs

## 2021-02-03 NOTE — Patient Instructions (Signed)
This visit was a preventative care visit, also known as wellness visit or routine physical.   Topics typically include healthy lifestyle, diet, exercise, preventative care, vaccinations, sick and well care, proper use of emergency dept and after hours care, as well as other concerns.     Recommendations: Continue to return yearly for your annual wellness and preventative care visits.  This gives [place] a chance to discuss healthy lifestyle, exercise, vaccinations, review your chart record, and perform screenings where appropriate.  I recommend you see your eye doctor yearly for routine vision care.  I recommend you see your dentist yearly for routine dental care including hygiene visits twice yearly.  See your gynecologist yearly for routine gynecological care.   Vaccination recommendations were reviewed Immunization History  Administered Date(s) Administered   Tdap 09/25/2014   I recommend a yearly flu shot in the fall    Screening for cancer: Colon cancer screening: Age 10  Breast cancer screening: You should perform a self breast exam monthly.   We reviewed recommendations for regular mammograms and breast cancer screening.  Cervical cancer screening: We reviewed recommendations for pap smear screening.   Skin cancer screening: Check your skin regularly for new changes, growing lesions, or other lesions of concern Come in for evaluation if you have skin lesions of concern.  Lung cancer screening: If you have a greater than 20 pack year history of tobacco use, then you may qualify for lung cancer screening with a chest CT scan.   Please call your insurance company to inquire about coverage for this test.  We currently don't have screenings for other cancers besides breast, cervical, colon, and lung cancers.  If you have a strong family history of cancer or have other cancer screening concerns, please let me know.    Bone health: Get at least 150 minutes of aerobic exercise  weekly Get weight bearing exercise at least once weekly Bone density test:  A bone density test is an imaging test that uses a type of X-ray to measure the amount of calcium and other minerals in your bones. The test may be used to diagnose or screen you for a condition that causes weak or thin bones (osteoporosis), predict your risk for a broken bone (fracture), or determine how well your osteoporosis treatment is working. The bone density test is recommended for females 65 and older, or females or males <65 if certain risk factors such as thyroid disease, long term use of steroids such as for asthma or rheumatological issues, vitamin D deficiency, estrogen deficiency, family history of osteoporosis, self or family history of fragility fracture in first degree relative.    Heart health: Get at least 150 minutes of aerobic exercise weekly Limit alcohol It is important to maintain a healthy blood pressure and healthy cholesterol numbers  Heart disease screening: Screening for heart disease includes screening for blood pressure, fasting lipids, glucose/diabetes screening, BMI height to weight ratio, reviewed of smoking status, physical activity, and diet.    Goals include blood pressure 120/80 or less, maintaining a healthy lipid/cholesterol profile, preventing diabetes or keeping diabetes numbers under good control, not smoking or using tobacco products, exercising most days per week or at least 150 minutes per week of exercise, and eating healthy variety of fruits and vegetables, healthy oils, and avoiding unhealthy food choices like fried food, fast food, high sugar and high cholesterol foods.    Other tests may possibly include EKG test, CT coronary calcium score, echocardiogram, exercise treadmill stress test.  Medical care options: I recommend you continue to seek care here first for routine care.  We try really hard to have available appointments Monday through Friday daytime hours  for sick visits, acute visits, and physicals.  Urgent care should be used for after hours and weekends for significant issues that cannot wait till the next day.  The emergency department should be used for significant potentially life-threatening emergencies.  The emergency department is expensive, can often have long wait times for less significant concerns, so try to utilize primary care, urgent care, or telemedicine when possible to avoid unnecessary trips to the emergency department.  Virtual visits and telemedicine have been introduced since the pandemic started in 2020, and can be convenient ways to receive medical care.  We offer virtual appointments as well to assist you in a variety of options to seek medical care.    Specific concerns: Chest pressure -  We will check routine labs EKG heart tracing reviewed This could be related to acid reflux and the trouble you are having with saliva and swallowing   Trouble swallowing, excess saliva  Labs today We may end up needing to refer you to a specialist Begin trial of omeprazole 40 mg daily.  1 tablet in the morning 45 to 60 minutes before breakfast. For now I would cut back on acidic foods, spicy foods, citrus and other things that can increase acid reflux   BMI >30/obesity  I recommend you work losing weight through healthy diet and exercise Exercise at least 5 days/week such as walking, jogging, aerobics or other

## 2021-02-04 LAB — TSH+FREE T4
Free T4: 1.31 ng/dL (ref 0.82–1.77)
TSH: 0.813 u[IU]/mL (ref 0.450–4.500)

## 2021-02-04 LAB — COMPREHENSIVE METABOLIC PANEL
ALT: 10 IU/L (ref 0–32)
AST: 12 IU/L (ref 0–40)
Albumin/Globulin Ratio: 1.9 (ref 1.2–2.2)
Albumin: 4.3 g/dL (ref 3.8–4.8)
Alkaline Phosphatase: 63 IU/L (ref 44–121)
BUN/Creatinine Ratio: 10 (ref 9–23)
BUN: 9 mg/dL (ref 6–20)
Bilirubin Total: 0.3 mg/dL (ref 0.0–1.2)
CO2: 24 mmol/L (ref 20–29)
Calcium: 9.7 mg/dL (ref 8.7–10.2)
Chloride: 99 mmol/L (ref 96–106)
Creatinine, Ser: 0.91 mg/dL (ref 0.57–1.00)
Globulin, Total: 2.3 g/dL (ref 1.5–4.5)
Glucose: 89 mg/dL (ref 65–99)
Potassium: 4.4 mmol/L (ref 3.5–5.2)
Sodium: 136 mmol/L (ref 134–144)
Total Protein: 6.6 g/dL (ref 6.0–8.5)
eGFR: 84 mL/min/{1.73_m2} (ref >59)

## 2021-02-04 LAB — PROLACTIN: Prolactin: 19.3 ng/mL (ref 4.8–23.3)

## 2021-02-04 LAB — CBC
Hematocrit: 39 % (ref 34.0–46.6)
Hemoglobin: 12.5 g/dL (ref 11.1–15.9)
MCH: 26.8 pg (ref 26.6–33.0)
MCHC: 32.1 g/dL (ref 31.5–35.7)
MCV: 84 fL (ref 79–97)
Platelets: 294 10*3/uL (ref 150–450)
RBC: 4.67 x10E6/uL (ref 3.77–5.28)
RDW: 13.4 % (ref 11.7–15.4)
WBC: 6.3 10*3/uL (ref 3.4–10.8)

## 2021-02-04 LAB — HEMOGLOBIN A1C
Est. average glucose Bld gHb Est-mCnc: 126 mg/dL
Hgb A1c MFr Bld: 6 % — ABNORMAL HIGH (ref 4.8–5.6)

## 2021-02-12 ENCOUNTER — Telehealth: Payer: Self-pay

## 2021-02-12 NOTE — Telephone Encounter (Signed)
P.A. OMEPRAZOLE  

## 2021-02-13 NOTE — Telephone Encounter (Signed)
P.A. denied, no OTC meds covered.  Printed [employer] discount card and called to pharmacy cost $6.50, pt informed

## 2021-03-10 ENCOUNTER — Other Ambulatory Visit: Payer: Self-pay | Admitting: Medical

## 2022-02-06 ENCOUNTER — Encounter: Payer: BC Managed Care – PPO | Admitting: Medical

## 2022-02-27 DIAGNOSIS — Z30432 Encounter for removal of intrauterine contraceptive device: Secondary | ICD-10-CM | POA: Diagnosis not present

## 2022-04-12 ENCOUNTER — Encounter: Payer: BC Managed Care – PPO | Admitting: Medical

## 2022-04-12 ENCOUNTER — Telehealth: Payer: Self-pay | Admitting: Medical

## 2022-04-12 DIAGNOSIS — Z0000 Encounter for general adult medical examination without abnormal findings: Secondary | ICD-10-CM

## 2022-04-12 NOTE — Telephone Encounter (Signed)

## 2022-04-13 ENCOUNTER — Encounter: Payer: Self-pay | Admitting: Medical

## 2023-09-26 ENCOUNTER — Encounter (HOSPITAL_BASED_OUTPATIENT_CLINIC_OR_DEPARTMENT_OTHER): Payer: Self-pay

## 2023-09-26 ENCOUNTER — Emergency Department (HOSPITAL_BASED_OUTPATIENT_CLINIC_OR_DEPARTMENT_OTHER)
Admission: EM | Admit: 2023-09-26 | Discharge: 2023-09-27 | Disposition: A | Discharge status: Home / Self Care | Attending: Emergency Medicine | Admitting: Emergency Medicine

## 2023-09-26 ENCOUNTER — Other Ambulatory Visit: Payer: Self-pay

## 2023-09-26 DIAGNOSIS — R519 Headache, unspecified: Secondary | ICD-10-CM | POA: Diagnosis present

## 2023-09-26 LAB — CBC
HCT: 31.3 % — ABNORMAL LOW (ref 36.0–46.0)
Hemoglobin: 10 g/dL — ABNORMAL LOW (ref 12.0–15.0)
MCH: 25 pg — ABNORMAL LOW (ref 26.0–34.0)
MCHC: 31.9 g/dL (ref 30.0–36.0)
MCV: 78.3 fL — ABNORMAL LOW (ref 80.0–100.0)
Platelets: 349 10*3/uL (ref 150–400)
RBC: 4 MIL/uL (ref 3.87–5.11)
RDW: 15.7 % — ABNORMAL HIGH (ref 11.5–15.5)
WBC: 10.7 10*3/uL — ABNORMAL HIGH (ref 4.0–10.5)
nRBC: 0 % (ref 0.0–0.2)

## 2023-09-26 LAB — PREGNANCY, URINE: Preg Test, Ur: NEGATIVE

## 2023-09-26 MED ORDER — DIPHENHYDRAMINE HCL 50 MG/ML IJ SOLN
25.0000 mg | Freq: Once | INTRAMUSCULAR | Status: AC
Start: 2023-09-26 — End: 2023-09-26
  Administered 2023-09-26: 25 mg via INTRAVENOUS
  Filled 2023-09-26: qty 1

## 2023-09-26 MED ORDER — SODIUM CHLORIDE 0.9 % IV BOLUS
1000.0000 mL | Freq: Once | INTRAVENOUS | Status: AC
  Administered 2023-09-26: 1000 mL via INTRAVENOUS

## 2023-09-26 MED ORDER — PROCHLORPERAZINE EDISYLATE 10 MG/2ML IJ SOLN
10.0000 mg | Freq: Once | INTRAMUSCULAR | Status: AC
  Administered 2023-09-26: 10 mg via INTRAVENOUS
  Filled 2023-09-26: qty 2

## 2023-09-26 NOTE — ED Provider Notes (Signed)
 [facility]ERGENCY [facility] Emergency Department Provider Note MRN:  [id number]  Arrival date & time: 09/27/23     Chief Complaint   Headache   History of Present Illness   Patricia Allen is a 38 y.o. year-old female with no pertinent past medical history presenting to the ED with chief complaint of headache.  Headache for the past 6 days, not going away, getting worse.  Pain at the top of the head, also pain and throbbing sensation with a whooshing sound in the ears.  This evening pain was even worse and she experienced some paresthesia or decreased sensation to the left arm at about 8 PM.  By 9 PM her arm felt back to normal.  Denies numbness or weakness to the legs, no vision loss, no trouble with speech or swallowing.  No fever.  Review of Systems  A thorough review of systems was obtained and all systems are negative except as noted in the HPI and PMH.   Patient's Health History    Past Medical History:  Diagnosis Date   Abnormal Pap smear    Resolved as of last pap.  Colpo 1/11   IUD (intrauterine device) in place     Past Surgical History:  Procedure Laterality Date   COLPOSCOPY      Family History  Problem Relation Age of Onset   Hypertension Maternal Grandmother    Hypertension Paternal Grandmother    Cancer Neg Hx    Heart disease Neg Hx    Diabetes Neg Hx    Stroke Neg Hx     Social History   Socioeconomic History   Marital status: Married    Spouse name: Not on file   Number of children: Not on file   Years of education: Not on file   Highest education level: Not on file  Occupational History   Not on file  Tobacco Use   Smoking status: Never   Smokeless tobacco: Never  Vaping Use   Vaping status: Never Used  Substance and Sexual Activity   Alcohol use: No   Drug use: No   Sexual activity: Yes    Comment: Pregnant  Other Topics Concern   Not on file  Social History Narrative   Lives with husband and 3 children.   Hair stylist.    Exercise - walk.  01/2021   Social Drivers of Health   Financial Resource Strain: Not on file  Food Insecurity: Not on file  Transportation Needs: Not on file  Physical Activity: Not on file  Stress: Not on file  Social Connections: Not on file  Intimate Partner Violence: Not on file     Physical Exam   Vitals:   09/27/23 0145 09/27/23 0300  BP: 115/71 111/63  Pulse: 88 (!) 106  Resp:    Temp:    SpO2: 97% 95%    CONSTITUTIONAL: Well-appearing, NAD NEURO/PSYCH:  Alert and oriented x 3, normal and symmetric strength and sensation, normal coordination, normal speech EYES:  eyes equal and reactive ENT/NECK:  no LAD, no JVD CARDIO: Regular rate, well-perfused, normal S1 and S2 PULM:  CTAB no wheezing or rhonchi GI/GU:  non-distended, non-tender MSK/SPINE:  No gross deformities, no edema SKIN:  no rash, atraumatic   *Additional and/or pertinent findings included in MDM below  Diagnostic and Interventional Summary    EKG Interpretation Date/Time:    Ventricular Rate:    PR Interval:    QRS Duration:    QT Interval:    QTC  Calculation:   R Axis:      Text Interpretation:         Labs Reviewed  CBC - Abnormal; Notable for the following components:      Result Value   WBC 10.7 (*)    Hemoglobin 10.0 (*)    HCT 31.3 (*)    MCV 78.3 (*)    MCH 25.0 (*)    RDW 15.7 (*)    All other components within normal limits  COMPREHENSIVE METABOLIC PANEL WITH GFR - Abnormal; Notable for the following components:   Potassium 3.3 (*)    Glucose, Bld 101 (*)    Calcium 8.8 (*)    AST 12 (*)    All other components within normal limits  PREGNANCY, URINE    CT ANGIO HEAD NECK W WO CM  Final Result      Medications  diphenhydrAMINE (BENADRYL) injection 25 mg (25 mg Intravenous Given 09/26/23 2344)  prochlorperazine (COMPAZINE) injection 10 mg (10 mg Intravenous Given 09/26/23 2345)  sodium chloride 0.9 % bolus 1,000 mL (0 mLs Intravenous Stopped 09/27/23 0048)  iohexol  (OMNIPAQUE) 350 MG/ML injection 75 mL (75 mLs Intravenous Contrast Given 09/27/23 0110)  ketorolac (TORADOL) 15 MG/ML injection 30 mg (30 mg Intravenous Given 09/27/23 0143)  haloperidol lactate (HALDOL) injection 2 mg (2 mg Intravenous Given 09/27/23 0146)     Procedures  /  Critical Care Procedures  ED Course and Medical Decision Making  Initial Impression and Ddx Headache with some possible neurological symptoms this evening, left arm paresthesia or decreased sensation earlier.  Back to baseline currently with a normal neurological exam.  Given this neurological symptom in the throbbing whooshing sounds there is concern for possible CNS vascular etiology such as subarachnoid hemorrhage or aneurysm.  Patient does not normally experience headaches, especially noticed 60 headache.  Awaiting imaging.  Past medical/surgical history that increases complexity of ED encounter: None  Interpretation of Diagnostics I personally reviewed the Laboratory Testing and my interpretation is as follows: No significant blood count or electrolyte disturbance.    Patient Reassessment and Ultimate Disposition/Management     Patient's headache is resolved after medications above.  CTA is unremarkable.  Overall favoring migraine or complex migraine rather than acute ischemic stroke or TIA.  6 days of a headache followed by transient paresthesia of the left arm would be a very odd presentation for an ischemic event.  Patient also has little to no cardiovascular risk factors.  Patient feels well and would like to go home, advised follow-up with neurology, strict return precautions.  Patient management required discussion with the following services or consulting groups:  None  Complexity of Problems Addressed Acute illness or injury that poses threat of life of bodily function  Additional Data Reviewed and Analyzed Further history obtained from: Further history from spouse/family member  Additional Factors  Impacting ED Encounter Risk Consideration of hospitalization  Merrick Abe. Harless Lien, MD [facility] Emergency Medicine [facility] [email or web address]  Final Clinical Impressions(s) / ED Diagnoses     ICD-10-CM   1. Acute nonintractable headache, unspecified headache type  R51.9 Ambulatory referral to Neurology      ED Discharge Orders          Ordered    prochlorperazine (COMPAZINE) 10 MG tablet  2 times daily PRN        09/27/23 0335    Ambulatory referral to Neurology       Comments: An appointment is requested in approximately: 2 weeks  09/27/23 0336             Discharge Instructions Discussed with and Provided to Patient:     Discharge Instructions      You were evaluated in the Emergency Department and after careful evaluation, we did not find any emergent condition requiring admission or further testing in the hospital.  Your exam/testing today is overall reassuring.  Symptoms may be due to a migraine.  Can use the Compazine at home as needed for headaches.  Would recommend follow-up with neurology to further discuss your headaches if they continue.  Please return to the Emergency Department if you experience any worsening of your condition.   Thank you for allowing us  to be a part of your care.       Edson Graces, MD 09/27/23 [phone]

## 2023-09-26 NOTE — ED Triage Notes (Signed)
 Pt reports Headache since 11APR. Pt reports taking Motrin and Excedrin with no relief. Pt reports a "buzzing" in her ear. Pt denies any trauma or injury.

## 2023-09-27 ENCOUNTER — Emergency Department (HOSPITAL_BASED_OUTPATIENT_CLINIC_OR_DEPARTMENT_OTHER)

## 2023-09-27 LAB — COMPREHENSIVE METABOLIC PANEL WITH GFR
ALT: 10 U/L (ref 0–44)
AST: 12 U/L — ABNORMAL LOW (ref 15–41)
Albumin: 3.7 g/dL (ref 3.5–5.0)
Alkaline Phosphatase: 61 U/L (ref 38–126)
Anion gap: 8 (ref 5–15)
BUN: 11 mg/dL (ref 6–20)
CO2: 26 mmol/L (ref 22–32)
Calcium: 8.8 mg/dL — ABNORMAL LOW (ref 8.9–10.3)
Chloride: 101 mmol/L (ref 98–111)
Creatinine, Ser: 0.88 mg/dL (ref 0.44–1.00)
GFR, Estimated: >60 mL/min (ref >60)
Glucose, Bld: 101 mg/dL — ABNORMAL HIGH (ref 70–99)
Potassium: 3.3 mmol/L — ABNORMAL LOW (ref 3.5–5.1)
Sodium: 135 mmol/L (ref 135–145)
Total Bilirubin: 0.3 mg/dL (ref 0.0–1.2)
Total Protein: 6.7 g/dL (ref 6.5–8.1)

## 2023-09-27 MED ORDER — IOHEXOL 350 MG/ML SOLN
75.0000 mL | Freq: Once | INTRAVENOUS | Status: AC | PRN
  Administered 2023-09-27: 75 mL via INTRAVENOUS

## 2023-09-27 MED ORDER — HALOPERIDOL LACTATE 5 MG/ML IJ SOLN
2.0000 mg | Freq: Once | INTRAMUSCULAR | Status: AC
  Administered 2023-09-27: 2 mg via INTRAVENOUS
  Filled 2023-09-27: qty 1

## 2023-09-27 MED ORDER — PROCHLORPERAZINE MALEATE 10 MG PO TABS: 10.0000 mg | ORAL_TABLET | Freq: Two times a day (BID) | ORAL | 0 refills | Status: DC | PRN

## 2023-09-27 MED ORDER — KETOROLAC TROMETHAMINE 15 MG/ML IJ SOLN
30.0000 mg | Freq: Once | INTRAMUSCULAR | Status: AC
  Administered 2023-09-27: 30 mg via INTRAVENOUS
  Filled 2023-09-27: qty 2

## 2023-09-27 NOTE — Discharge Instructions (Addendum)
 You were evaluated in the Emergency Department and after careful evaluation, we did not find any emergent condition requiring admission or further testing in the hospital.  Your exam/testing today is overall reassuring.  Symptoms may be due to a migraine.  Can use the Compazine at home as needed for headaches.  Would recommend follow-up with neurology to further discuss your headaches if they continue.  Please return to the Emergency Department if you experience any worsening of your condition.   Thank you for allowing us  to be a part of your care.

## 2024-05-26 ENCOUNTER — Inpatient Hospital Stay (HOSPITAL_COMMUNITY)
Admission: AD | Admit: 2024-05-26 | Discharge: 2024-05-26 | Disposition: A | Discharge status: Home / Self Care | Attending: Obstetrics & Gynecology | Admitting: Obstetrics & Gynecology

## 2024-05-26 ENCOUNTER — Encounter (HOSPITAL_COMMUNITY): Payer: Self-pay | Admitting: Obstetrics & Gynecology

## 2024-05-26 ENCOUNTER — Other Ambulatory Visit: Payer: Self-pay

## 2024-05-26 ENCOUNTER — Inpatient Hospital Stay (HOSPITAL_COMMUNITY)

## 2024-05-26 DIAGNOSIS — Z113 Encounter for screening for infections with a predominantly sexual mode of transmission: Secondary | ICD-10-CM | POA: Diagnosis present

## 2024-05-26 DIAGNOSIS — O3680X0 Pregnancy with inconclusive fetal viability, not applicable or unspecified: Secondary | ICD-10-CM | POA: Diagnosis not present

## 2024-05-26 DIAGNOSIS — O209 Hemorrhage in early pregnancy, unspecified: Secondary | ICD-10-CM | POA: Diagnosis present

## 2024-05-26 DIAGNOSIS — Z3A11 11 weeks gestation of pregnancy: Secondary | ICD-10-CM

## 2024-05-26 DIAGNOSIS — O4690 Antepartum hemorrhage, unspecified, unspecified trimester: Secondary | ICD-10-CM

## 2024-05-26 DIAGNOSIS — R109 Unspecified abdominal pain: Secondary | ICD-10-CM | POA: Diagnosis present

## 2024-05-26 LAB — WET PREP, GENITAL
Clue Cells Wet Prep HPF POC: NONE SEEN
Sperm: NONE SEEN
Trich, Wet Prep: NONE SEEN
WBC, Wet Prep HPF POC: <10 (ref <10)
Yeast Wet Prep HPF POC: NONE SEEN

## 2024-05-26 LAB — COMPREHENSIVE METABOLIC PANEL WITH GFR
ALT: 12 U/L (ref 0–44)
AST: 16 U/L (ref 15–41)
Albumin: 3.3 g/dL — ABNORMAL LOW (ref 3.5–5.0)
Alkaline Phosphatase: 52 U/L (ref 38–126)
Anion gap: 8 (ref 5–15)
BUN: 10 mg/dL (ref 6–20)
CO2: 24 mmol/L (ref 22–32)
Calcium: 8.9 mg/dL (ref 8.9–10.3)
Chloride: 108 mmol/L (ref 98–111)
Creatinine, Ser: 0.83 mg/dL (ref 0.44–1.00)
GFR, Estimated: >60 mL/min (ref >60)
Glucose, Bld: 108 mg/dL — ABNORMAL HIGH (ref 70–99)
Potassium: 3.9 mmol/L (ref 3.5–5.1)
Sodium: 140 mmol/L (ref 135–145)
Total Bilirubin: 0.7 mg/dL (ref 0.0–1.2)
Total Protein: 6.5 g/dL (ref 6.5–8.1)

## 2024-05-26 LAB — URINALYSIS, ROUTINE W REFLEX MICROSCOPIC
Bilirubin Urine: NEGATIVE
Glucose, UA: NEGATIVE mg/dL
Ketones, ur: NEGATIVE mg/dL
Leukocytes,Ua: NEGATIVE
Nitrite: NEGATIVE
Protein, ur: NEGATIVE mg/dL
Specific Gravity, Urine: 1.004 — ABNORMAL LOW (ref 1.005–1.030)
pH: 7 (ref 5.0–8.0)

## 2024-05-26 LAB — CBC
HCT: 32 % — ABNORMAL LOW (ref 36.0–46.0)
Hemoglobin: 10 g/dL — ABNORMAL LOW (ref 12.0–15.0)
MCH: 24.5 pg — ABNORMAL LOW (ref 26.0–34.0)
MCHC: 31.3 g/dL (ref 30.0–36.0)
MCV: 78.4 fL — ABNORMAL LOW (ref 80.0–100.0)
Platelets: 292 K/uL (ref 150–400)
RBC: 4.08 MIL/uL (ref 3.87–5.11)
RDW: 17.5 % — ABNORMAL HIGH (ref 11.5–15.5)
WBC: 4.8 K/uL (ref 4.0–10.5)
nRBC: 0 % (ref 0.0–0.2)

## 2024-05-26 LAB — POCT PREGNANCY, URINE: Preg Test, Ur: POSITIVE — AB

## 2024-05-26 LAB — ABO/RH: ABO/RH(D): O POS

## 2024-05-26 LAB — HCG, QUANTITATIVE, PREGNANCY: hCG, Beta Chain, Quant, S: 289 m[IU]/mL — ABNORMAL HIGH (ref <5)

## 2024-05-26 NOTE — MAU Note (Signed)
.  [name] is a 38 y.o. at Unknown here in MAU reporting: Reporting bleeding and cramping since December 8. LMP: 03/07/2024 Onset of complaint: December 8 Pain score: 5/10 Vitals:   05/26/24 0844  BP: 131/74  Pulse: 90  Resp: 20  Temp: 98.5 F (36.9 C)  SpO2: 100%      Lab orders placed from triage:   poct preg

## 2024-05-26 NOTE — MAU Provider Note (Signed)
 None     S [name] is a 38 y.o. [phone] pregnant female at [redacted]w[redacted]d who presents to MAU today with complaint of vaginal bleeding since 12/8. She reports that it is heavy at times, and has passed clots. She reports current pain 5/10.   Has not initiated [facility].   Pertinent items noted in HPI and remainder of comprehensive ROS otherwise negative.   O BP 131/74 (BP Location: Right Arm)   Pulse 90   Temp 98.5 F (36.9 C) (Oral)   Resp 20   LMP 03/07/2024   SpO2 100%  Physical Exam Vitals reviewed.  Constitutional:      General: She is not in acute distress.    Appearance: Normal appearance. She is not ill-appearing, toxic-appearing or diaphoretic.  HENT:     Head: Normocephalic.  Cardiovascular:     Rate and Rhythm: Normal rate.     Pulses: Normal pulses.  Pulmonary:     Effort: Pulmonary effort is normal.  Skin:    General: Skin is warm and dry.     Capillary Refill: Capillary refill takes less than 2 seconds.  Neurological:     General: No focal deficit present.     Mental Status: She is alert and oriented to person, place, and time.  Psychiatric:        Mood and Affect: Mood normal.        Behavior: Behavior normal.        Thought Content: Thought content normal.        Judgment: Judgment normal.      MDM:  Moderate  MAU Course:  A Pregnancy of unknown anatomic location - Plan: Discharge patient  Vaginal bleeding during pregnancy  Medical screening exam complete - CUBA workup ordered. Hcg level 289. US : No IUP identified, no ectopic or other concern identified either.  -  Consulted Dr. Jayne who recommended follow up 12/18 for Hcg trend.   P Discharge from MAU in stable condition with strict precautions Follow up at MAU lab as scheduled 05/29/24 for follow up hcg. Patient aware.   Allergies as of 05/26/2024       Reactions   Apple Juice Itching   Other    Herbicides        Medication List     STOP taking these medications    ferrous  fumarate 325 (106 Fe) MG Tabs tablet Commonly known as: HEMOCYTE - 106 mg FE   ibuprofen  600 MG tablet Commonly known as: ADVIL    Mirena (52 MG) 20 MCG/24HR Iud Generic drug: levonorgestrel   omeprazole  40 MG capsule Commonly known as: PRILOSEC   prochlorperazine  10 MG tablet Commonly known as: COMPAZINE         Camie Rote, MSN, CNM 05/26/2024 2:13 PM  Certified Nurse Midwife, [facility]

## 2024-05-27 LAB — GC/CHLAMYDIA PROBE AMP (~~LOC~~) NOT AT ARMC
Chlamydia: NEGATIVE
Comment: NEGATIVE
Comment: NORMAL
Neisseria Gonorrhea: NEGATIVE

## 2024-05-29 ENCOUNTER — Ambulatory Visit: Payer: Self-pay | Admitting: Family Medicine

## 2024-05-29 ENCOUNTER — Other Ambulatory Visit (HOSPITAL_COMMUNITY): Admit: 2024-05-29 | Discharge: 2024-05-29 | Attending: Obstetrics & Gynecology

## 2024-05-29 ENCOUNTER — Inpatient Hospital Stay (HOSPITAL_COMMUNITY)
Admission: AD | Admit: 2024-05-29 | Discharge: 2024-05-29 | Disposition: A | Discharge status: Home / Self Care | Payer: Self-pay | Attending: Obstetrics & Gynecology | Admitting: Obstetrics & Gynecology

## 2024-05-29 DIAGNOSIS — Z3A11 11 weeks gestation of pregnancy: Secondary | ICD-10-CM

## 2024-05-29 DIAGNOSIS — O26851 Spotting complicating pregnancy, first trimester: Secondary | ICD-10-CM | POA: Insufficient documentation

## 2024-05-29 DIAGNOSIS — O209 Hemorrhage in early pregnancy, unspecified: Secondary | ICD-10-CM | POA: Diagnosis not present

## 2024-05-29 LAB — HCG, QUANTITATIVE, PREGNANCY: hCG, Beta Chain, Quant, S: 85 m[IU]/mL — ABNORMAL HIGH (ref <5)

## 2024-05-29 MED ORDER — ONDANSETRON HCL 4 MG/2ML IJ SOLN: 4.0000 mg | Freq: Once | INTRAMUSCULAR | Status: DC

## 2024-05-29 MED ORDER — LACTATED RINGERS IV BOLUS: 1000.0000 mL | Freq: Once | INTRAVENOUS | Status: DC

## 2024-05-29 NOTE — MAU Provider Note (Signed)
 None     S [name] is a 38 y.o. [phone] female at [redacted]w[redacted]d who presents to MAU today for repeat hCG. She reports light bleeding, denies pain. She was initially seen in the MAU on 12/15 reporting vaginal bleeding and clots for 7 days and 5/10 abdominal pain. No IUP on US , hCG 289 at that time.  Pertinent items noted in HPI and remainder of comprehensive ROS otherwise negative.   O BP 114/76   Pulse 75   Temp 99 F (37.2 C)   Resp 18   Ht 5' 5 (1.651 m)   Wt 92.8 kg   LMP 03/07/2024   BMI 34.03 kg/m  Physical Exam Vitals reviewed.  Constitutional:      General: She is not in acute distress.    Appearance: She is well-developed. She is not diaphoretic.  Eyes:     General: No scleral icterus. Pulmonary:     Effort: Pulmonary effort is normal. No respiratory distress.  Skin:    General: Skin is warm and dry.  Neurological:     Mental Status: She is alert.     Coordination: Coordination normal.    No results found for this or any previous visit (from the past 24 hours).  MDM: Low MAU Course: -Vital signs within normal limits. No pain, bleeding is lighter. -Repeating hCG for suspected SAB. -Patient desires to go home and be contacted with results. Discussed red flag symptoms that would warrant return to MAU.  A 1. Vaginal bleeding in pregnancy, first trimester (Primary) - Discharge patient  Medical screening exam complete  P Discharge from MAU in stable condition with ectopic precautions  Future Appointments  Date Time Provider Department Center  05/29/2024 10:00 AM [facility] 1 [facility] None   Allergies as of 05/29/2024       Reactions   Apple Juice Itching   Other    Herbicides        Medication List    You have not been prescribed any medications.     Joesph DELENA Sear, PA

## 2024-05-29 NOTE — Discharge Instructions (Signed)
 Reasons to return to MAU at [facility]: If you have heavier bleeding that soaks through more that 2 pads per hour for an hour or more If you bleed so much that you feel like you might pass out or you do pass out If you have significant abdominal pain that is not improved with Tylenol  1000 mg every 8 hours as needed for pain If you develop a fever > 100.5

## 2024-05-29 NOTE — MAU Note (Signed)
 Her for repeat HCG. Deneis any pain and reports small amount of bleeding.

## 2024-05-30 NOTE — Telephone Encounter (Addendum)
 Notified pt of miscarriage diagnosis, offered condolences.  Pt denies any pain and very minimal vaginal bleeding at this time.  She verbalized understanding of results and declined to schedule follow up visit.   [place], RN  --- Message from Joesph DELENA Sear sent at 05/30/2024  3:17 AM EST ----- Please call to let her know the blood level that we checked in the MAU went down, indicating that she is having a miscarriage.   We can also offer a follow up in 2 weeks in the office!
